# Patient Record
Sex: Female | Born: 1985 | Race: Asian | Hispanic: Yes | Marital: Married | Smoking: Never smoker
Health system: Southern US, Community
[De-identification: ages and names within clinical notes are randomized; demographics above are authoritative.]

## PROBLEM LIST (undated history)

## (undated) DIAGNOSIS — D352 Benign neoplasm of pituitary gland: Secondary | ICD-10-CM

## (undated) HISTORY — DX: Benign neoplasm of pituitary gland: D35.2

---

## 2017-08-26 ENCOUNTER — Ambulatory Visit (INDEPENDENT_AMBULATORY_CARE_PROVIDER_SITE_OTHER): Payer: BLUE CROSS/BLUE SHIELD

## 2017-08-26 ENCOUNTER — Ambulatory Visit (HOSPITAL_COMMUNITY)
Admission: EM | Admit: 2017-08-26 | Discharge: 2017-08-26 | Disposition: A | Discharge status: Home / Self Care | Payer: BLUE CROSS/BLUE SHIELD | Attending: Family Medicine | Admitting: Family Medicine

## 2017-08-26 DIAGNOSIS — M7989 Other specified soft tissue disorders: Secondary | ICD-10-CM

## 2017-08-26 DIAGNOSIS — S99911A Unspecified injury of right ankle, initial encounter: Secondary | ICD-10-CM

## 2017-08-26 DIAGNOSIS — M799 Soft tissue disorder, unspecified: Secondary | ICD-10-CM

## 2017-08-26 NOTE — ED Triage Notes (Signed)
Per pt she stepped on a trench and twisted her left ankle, per pt she normally have lower hr

## 2017-08-26 NOTE — Discharge Instructions (Signed)
[facility] on [address].  [place] [address]

## 2017-08-26 NOTE — ED Provider Notes (Signed)
  [facility]    CSN: [id number] Arrival date & time: 08/26/17  1315   Musculoskeletal Exam  Patient: Denise Jennings DOB: 12/12/1985  DOS: 08/26/2017  SUBJECTIVE:  Chief Complaint:   No chief complaint on file.   Denise Jennings is a 32 y.o.  female for evaluation and treatment of L ankle pain.   Onset:  2 days ago.  Turned ankle inwards on uneven surface.  Location: R outside of ankle Character:  aching  Progression of issue:  has slightly improved Associated symptoms: she notices a circular mass in front of her ankle Treatment: to date has been none.   Neurovascular symptoms: no  ROS: Musculoskeletal/Extremities: +L ankle pain  No known health problems.  Objective: VITAL SIGNS: BP 101/66 (BP Location: Left Arm)   Pulse (!) 50   Temp 98.3 F (36.8 C) (Oral)   SpO2 97%  Constitutional: Well formed, well developed. No acute distress. Cardiovascular: Brisk cap refill Thorax & Lungs: No accessory muscle use Musculoskeletal: L ankle.   Normal active range of motion: yes.   Normal passive range of motion: yes Tenderness to palpation: mild ttp over lat mall Deformity: no Ecchymosis: no Tests positive: none Tests negative: ant drawer, squeeze There is a small and circular lesion ant to lat mall that is freely moveable and semisolid in nature. No TTP over this area.  Neurologic: Normal sensory function. No focal deficits noted. DTR's equal and symmetry in LE's. No clonus. Psychiatric: Normal mood. Age appropriate judgment and insight. Alert & oriented x 3.    DG Left Ankle Complete IMPRESSION: Negative.  Electronically Signed   By: Rolm Baptise M.D.   On: 08/26/2017 16:07  Assessment:  Injury of right ankle, initial encounter  Soft tissue mass  Plan: Rec'd Denise Jennings for ortho if she wishes to pursue seeing an orthopod. I think this has likely been there before she turned her ankle and any pain associated with it is related to the injury, not the actual mass.  XR images printed for pt.  Find a PCP to f/u with if needed. The patient voiced understanding and agreement to the plan.    Denise Jennings, [place], [place] 08/26/17 [phone]

## 2018-05-26 ENCOUNTER — Encounter (HOSPITAL_COMMUNITY): Payer: Self-pay

## 2018-05-26 ENCOUNTER — Ambulatory Visit (HOSPITAL_COMMUNITY)
Admission: EM | Admit: 2018-05-26 | Discharge: 2018-05-26 | Disposition: A | Discharge status: Home / Self Care | Payer: BLUE CROSS/BLUE SHIELD | Attending: Physician Assistant | Admitting: Physician Assistant

## 2018-05-26 DIAGNOSIS — J00 Acute nasopharyngitis [common cold]: Secondary | ICD-10-CM

## 2018-05-26 MED ORDER — FLUTICASONE PROPIONATE 50 MCG/ACT NA SUSP: 2.0000 | Freq: Every day | NASAL | 0 refills | Status: DC

## 2018-05-26 MED ORDER — IPRATROPIUM BROMIDE 0.06 % NA SOLN: 2.0000 | Freq: Four times a day (QID) | NASAL | 0 refills | Status: DC

## 2018-05-26 NOTE — ED Provider Notes (Signed)
[facility]    CSN: [id number] Arrival date & time: 05/26/18  1138     History   Chief Complaint Chief Complaint  Patient presents with  . Cough  . Sore Throat    HPI Denise Jennings is a 32 y.o. female.   32 year old female comes in for few day history of URI symptoms.  Has had rhinorrhea, nasal congestion, sinus pressure, sore throat, ear pain.  No obvious fever, chills, night sweats.  Denies cough, shortness of breath, wheezing.  Has not taken anything for symptoms.  No obvious sick contact.  Never smoker.     History reviewed. No pertinent past medical history.  There are no active problems to display for this patient.   History reviewed. No pertinent surgical history.  OB History   None      Home Medications    Prior to Admission medications   Medication Sig Start Date End Date Taking? Authorizing Provider  fluticasone (FLONASE) 50 MCG/ACT nasal spray Place 2 sprays into both nostrils daily. 05/26/18   Tasia Catchings, Usbaldo Pannone V, PA-C  ipratropium (ATROVENT) 0.06 % nasal spray Place 2 sprays into both nostrils 4 (four) times daily. 05/26/18   Tasia Catchings, Catherin Doorn V, PA-C  norgestimate-ethinyl estradiol (ORTHO-CYCLEN,SPRINTEC,PREVIFEM) 0.25-35 MG-MCG tablet Take 1 tablet by mouth daily.    [provider]    Family History Family History  Family history unknown: Yes    Social History Social History   Tobacco Use  . Smoking status: Never Smoker  . Smokeless tobacco: Never Used  Substance Use Topics  . Alcohol use: Not on file  . Drug use: Not on file     Allergies   Patient has no known allergies.   Review of Systems Review of Systems  Reason unable to perform ROS: See HPI as above.     Physical Exam Triage Vital Signs ED Triage Vitals  Enc Vitals Group     BP 05/26/18 1254 (!) 108/58     Pulse Rate 05/26/18 1254 76     Resp 05/26/18 1254 20     Temp 05/26/18 1254 98.6 F (37 C)     Temp Source 05/26/18 1254 Oral     SpO2 05/26/18 1254 100 %   Weight --      Height --      Head Circumference --      Peak Flow --      Pain Score 05/26/18 1253 6     Pain Loc --      Pain Edu? --      Excl. in [place]? --    No data found.  Updated Vital Signs BP (!) 108/58 (BP Location: Right Arm)   Pulse 76   Temp 98.6 F (37 C) (Oral)   Resp 20   LMP 05/07/2018   SpO2 100%   Physical Exam  Constitutional: She is oriented to person, place, and time. She appears well-developed and well-nourished.  Non-toxic appearance. She does not appear ill. No distress.  HENT:  Head: Normocephalic and atraumatic.  Right Ear: Tympanic membrane, external ear and ear canal normal. Tympanic membrane is not erythematous and not bulging.  Left Ear: Tympanic membrane, external ear and ear canal normal. Tympanic membrane is not erythematous and not bulging.  Nose: Rhinorrhea present. Right sinus exhibits no maxillary sinus tenderness and no frontal sinus tenderness. Left sinus exhibits no maxillary sinus tenderness and no frontal sinus tenderness.  Mouth/Throat: Uvula is midline, oropharynx is clear and moist and mucous membranes are normal.  No tonsillar exudate.  Eyes: Pupils are equal, round, and reactive to light. Conjunctivae are normal.  Neck: Normal range of motion. Neck supple.  Cardiovascular: Normal rate, regular rhythm and normal heart sounds. Exam reveals no gallop and no friction rub.  No murmur heard. Pulmonary/Chest: Effort normal and breath sounds normal. She has no decreased breath sounds. She has no wheezes. She has no rhonchi. She has no rales.  Lymphadenopathy:    She has no cervical adenopathy.  Neurological: She is alert and oriented to person, place, and time.  Skin: Skin is warm and dry.  Psychiatric: She has a normal mood and affect. Her behavior is normal. Judgment normal.     UC Treatments / Results  Labs (all labs ordered are listed, but only abnormal results are displayed) Labs Reviewed - No data to  display  EKG None  Radiology No results found.  Procedures Procedures (including critical care time)  Medications Ordered in UC Medications - No data to display  Initial Impression / Assessment and Plan / UC Course  I have reviewed the triage vital signs and the nursing notes.  Pertinent labs & imaging results that were available during my care of the patient were reviewed by me and considered in my medical decision making (see chart for details).    Discussed with patient history and exam most consistent with viral URI. Symptomatic treatment as needed. Push fluids. Return precautions given.   Final Clinical Impressions(s) / UC Diagnoses   Final diagnoses:  Acute nasopharyngitis    ED Prescriptions    Medication Sig Dispense Auth. Provider   fluticasone (FLONASE) 50 MCG/ACT nasal spray Place 2 sprays into both nostrils daily. 1 g Rosealee Recinos V, PA-C   ipratropium (ATROVENT) 0.06 % nasal spray Place 2 sprays into both nostrils 4 (four) times daily. 15 mL Tobin Chad, [place] 05/26/18 1350

## 2018-05-26 NOTE — Discharge Instructions (Signed)
Start flonase, atrovent nasal spray for nasal congestion/drainage. You can use over the counter nasal saline rinse such as neti pot for nasal congestion. Keep hydrated, your urine should be clear to pale yellow in color. Tylenol/motrin for fever and pain. Monitor for any worsening of symptoms, chest pain, shortness of breath, wheezing, swelling of the throat, follow up for reevaluation.  ° °For sore throat/cough try using a honey-based tea. Use 3 teaspoons of honey with juice squeezed from half lemon. Place shaved pieces of ginger into 1/2-1 cup of water and warm over stove top. Then mix the ingredients and repeat every 4 hours as needed. ° °

## 2018-05-26 NOTE — ED Triage Notes (Signed)
Pt present throat pain with a cough and congestion that started on Tuesday.

## 2018-07-02 ENCOUNTER — Other Ambulatory Visit: Payer: Self-pay

## 2018-07-02 ENCOUNTER — Ambulatory Visit (HOSPITAL_COMMUNITY)
Admission: EM | Admit: 2018-07-02 | Discharge: 2018-07-02 | Disposition: A | Discharge status: Home / Self Care | Payer: BLUE CROSS/BLUE SHIELD | Attending: Urgent Care | Admitting: Urgent Care

## 2018-07-02 ENCOUNTER — Encounter (HOSPITAL_COMMUNITY): Payer: Self-pay | Admitting: Emergency Medicine

## 2018-07-02 DIAGNOSIS — Z23 Encounter for immunization: Secondary | ICD-10-CM | POA: Insufficient documentation

## 2018-07-02 DIAGNOSIS — M79645 Pain in left finger(s): Secondary | ICD-10-CM | POA: Diagnosis not present

## 2018-07-02 DIAGNOSIS — W260XXA Contact with knife, initial encounter: Secondary | ICD-10-CM | POA: Diagnosis not present

## 2018-07-02 DIAGNOSIS — S61317A Laceration without foreign body of left little finger with damage to nail, initial encounter: Secondary | ICD-10-CM | POA: Insufficient documentation

## 2018-07-02 MED ORDER — TETANUS-DIPHTH-ACELL PERTUSSIS 5-2.5-18.5 LF-MCG/0.5 IM SUSP
INTRAMUSCULAR | Status: AC
  Filled 2018-07-02: qty 0.5

## 2018-07-02 MED ORDER — MELOXICAM 7.5 MG PO TABS: 7.5000 mg | ORAL_TABLET | Freq: Every day | ORAL | 0 refills | Status: DC

## 2018-07-02 MED ORDER — TETANUS-DIPHTH-ACELL PERTUSSIS 5-2.5-18.5 LF-MCG/0.5 IM SUSP
0.5000 mL | Freq: Once | INTRAMUSCULAR | Status: AC
  Administered 2018-07-02: 0.5 mL via INTRAMUSCULAR

## 2018-07-02 NOTE — ED Triage Notes (Addendum)
Cut finger yesterday while chopping vegetables.  Injury to left little finger.  Avulsion to finger tip

## 2018-07-02 NOTE — ED Provider Notes (Signed)
  MRN: [id number] DOB: Dec 26, 1985  Subjective:   Denise Jennings is a 33 y.o. female presenting for >24 left little finger laceration.   Patient was using a knife to cut vegetables and accidentally cut her hand.  Reports having constant, mild achy pain over the wound with intermittent sharp pains. She has cleaned her wound and applied Neosporin.  She cannot recall her last Tdap.  Denies fever, nausea, vomiting, loss of sensation, redness, warmth, drainage of pus or bleeding.  She has kept her wound covered with a Band-Aid.  No current facility-administered medications for this encounter.   Current Outpatient Medications:  .  fluticasone (FLONASE) 50 MCG/ACT nasal spray, Place 2 sprays into both nostrils daily., Disp: 1 g, Rfl: 0 .  ipratropium (ATROVENT) 0.06 % nasal spray, Place 2 sprays into both nostrils 4 (four) times daily., Disp: 15 mL, Rfl: 0 .  norgestimate-ethinyl estradiol (ORTHO-CYCLEN,SPRINTEC,PREVIFEM) 0.25-35 MG-MCG tablet, Take 1 tablet by mouth daily., Disp: , Rfl:    No Known Allergies  Denies past medical and surgical history.   Objective:   Vitals: BP (!) 100/52 (BP Location: Left Arm)   Pulse 62   Temp 98.6 F (37 C) (Oral)   Resp 16   SpO2 100%   Physical Exam Constitutional:      General: She is not in acute distress.    Appearance: Normal appearance. She is well-developed. She is not ill-appearing.  HENT:     Head: Normocephalic and atraumatic.     Nose: Nose normal.     Mouth/Throat:     Mouth: Mucous membranes are moist.     Pharynx: Oropharynx is clear.  Eyes:     General: No scleral icterus.    Extraocular Movements: Extraocular movements intact.     Pupils: Pupils are equal, round, and reactive to light.  Cardiovascular:     Rate and Rhythm: Normal rate.  Pulmonary:     Effort: Pulmonary effort is normal.  Musculoskeletal:     Left hand: She exhibits tenderness and laceration. She exhibits normal range of motion, no bony tenderness, normal capillary  refill and no swelling. Normal sensation noted. Normal strength noted.       Hands:  Skin:    General: Skin is warm and dry.  Neurological:     General: No focal deficit present.     Mental Status: She is alert and oriented to person, place, and time.  Psychiatric:        Mood and Affect: Mood normal.        Behavior: Behavior normal.    Assessment and Plan :   Laceration of left little finger with damage to nail, foreign body presence unspecified, initial encounter  Finger pain, left  Need for diphtheria-tetanus-pertussis (Tdap) vaccine  Wound not amenable to repair given the nature and age of the wound.  Counseled on wound care, updated her Tdap.  She is to use meloxicam at home.  ER and return to clinic precautions reviewed.   Roy, [place], PA-C 07/02/18 1118

## 2018-07-02 NOTE — Discharge Instructions (Signed)
Change your dressing twice daily for the next 1-2 weeks as your scab heals and your skin is replaced. Remove your dressing gently by soaking in warm soapy water for 5-10 minutes. If you develop redness, fever, hot finger/hand, worsening pain, then return for a recheck.

## 2019-04-18 DIAGNOSIS — M67431 Ganglion, right wrist: Secondary | ICD-10-CM | POA: Diagnosis not present

## 2019-07-04 DIAGNOSIS — Z03818 Encounter for observation for suspected exposure to other biological agents ruled out: Secondary | ICD-10-CM | POA: Diagnosis not present

## 2019-09-24 DIAGNOSIS — Z6823 Body mass index (BMI) 23.0-23.9, adult: Secondary | ICD-10-CM | POA: Diagnosis not present

## 2019-09-24 DIAGNOSIS — E282 Polycystic ovarian syndrome: Secondary | ICD-10-CM | POA: Diagnosis not present

## 2019-09-24 DIAGNOSIS — Z01419 Encounter for gynecological examination (general) (routine) without abnormal findings: Secondary | ICD-10-CM | POA: Diagnosis not present

## 2020-05-29 DIAGNOSIS — Z20822 Contact with and (suspected) exposure to covid-19: Secondary | ICD-10-CM | POA: Diagnosis not present

## 2020-07-16 DIAGNOSIS — Z20822 Contact with and (suspected) exposure to covid-19: Secondary | ICD-10-CM | POA: Diagnosis not present

## 2020-10-28 DIAGNOSIS — Z1329 Encounter for screening for other suspected endocrine disorder: Secondary | ICD-10-CM | POA: Diagnosis not present

## 2020-10-28 DIAGNOSIS — N926 Irregular menstruation, unspecified: Secondary | ICD-10-CM | POA: Diagnosis not present

## 2020-10-28 DIAGNOSIS — Z131 Encounter for screening for diabetes mellitus: Secondary | ICD-10-CM | POA: Diagnosis not present

## 2020-10-28 DIAGNOSIS — Z319 Encounter for procreative management, unspecified: Secondary | ICD-10-CM | POA: Diagnosis not present

## 2020-10-28 DIAGNOSIS — Z13228 Encounter for screening for other metabolic disorders: Secondary | ICD-10-CM | POA: Diagnosis not present

## 2020-10-28 DIAGNOSIS — Z01419 Encounter for gynecological examination (general) (routine) without abnormal findings: Secondary | ICD-10-CM | POA: Diagnosis not present

## 2020-10-28 DIAGNOSIS — Z6825 Body mass index (BMI) 25.0-25.9, adult: Secondary | ICD-10-CM | POA: Diagnosis not present

## 2020-10-28 DIAGNOSIS — Z130 Encounter for screening for diseases of the blood and blood-forming organs and certain disorders involving the immune mechanism: Secondary | ICD-10-CM | POA: Diagnosis not present

## 2020-10-28 DIAGNOSIS — Z1388 Encounter for screening for disorder due to exposure to contaminants: Secondary | ICD-10-CM | POA: Diagnosis not present

## 2020-10-29 DIAGNOSIS — Z01419 Encounter for gynecological examination (general) (routine) without abnormal findings: Secondary | ICD-10-CM | POA: Diagnosis not present

## 2020-11-03 DIAGNOSIS — R891 Abnormal level of hormones in specimens from other organs, systems and tissues: Secondary | ICD-10-CM | POA: Diagnosis not present

## 2020-11-11 ENCOUNTER — Other Ambulatory Visit: Payer: Self-pay | Admitting: Obstetrics and Gynecology

## 2020-11-11 DIAGNOSIS — R947 Abnormal results of other endocrine function studies: Secondary | ICD-10-CM

## 2020-11-28 ENCOUNTER — Other Ambulatory Visit: Payer: Self-pay

## 2020-11-28 ENCOUNTER — Ambulatory Visit
Admission: RE | Admit: 2020-11-28 | Discharge: 2020-11-28 | Disposition: A | Discharge status: Home / Self Care | Payer: BLUE CROSS/BLUE SHIELD | Source: Ambulatory Visit | Attending: Obstetrics and Gynecology | Admitting: Obstetrics and Gynecology

## 2020-11-28 DIAGNOSIS — E237 Disorder of pituitary gland, unspecified: Secondary | ICD-10-CM | POA: Diagnosis not present

## 2020-11-28 DIAGNOSIS — J320 Chronic maxillary sinusitis: Secondary | ICD-10-CM | POA: Diagnosis not present

## 2020-11-28 DIAGNOSIS — R947 Abnormal results of other endocrine function studies: Secondary | ICD-10-CM

## 2020-11-28 DIAGNOSIS — E221 Hyperprolactinemia: Secondary | ICD-10-CM | POA: Diagnosis not present

## 2020-11-28 MED ORDER — GADOBENATE DIMEGLUMINE 529 MG/ML IV SOLN
7.0000 mL | Freq: Once | INTRAVENOUS | Status: AC | PRN
  Administered 2020-11-28: 11:00:00 7 mL via INTRAVENOUS

## 2021-03-04 ENCOUNTER — Other Ambulatory Visit: Payer: Self-pay

## 2021-03-04 ENCOUNTER — Encounter (HOSPITAL_BASED_OUTPATIENT_CLINIC_OR_DEPARTMENT_OTHER): Payer: Self-pay | Admitting: *Deleted

## 2021-03-04 ENCOUNTER — Emergency Department (HOSPITAL_BASED_OUTPATIENT_CLINIC_OR_DEPARTMENT_OTHER)
Admission: EM | Admit: 2021-03-04 | Discharge: 2021-03-04 | Disposition: A | Discharge status: Home / Self Care | Payer: 59 | Attending: Emergency Medicine | Admitting: Emergency Medicine

## 2021-03-04 ENCOUNTER — Emergency Department (HOSPITAL_BASED_OUTPATIENT_CLINIC_OR_DEPARTMENT_OTHER): Payer: 59 | Admitting: Radiology

## 2021-03-04 ENCOUNTER — Emergency Department (HOSPITAL_BASED_OUTPATIENT_CLINIC_OR_DEPARTMENT_OTHER): Payer: 59

## 2021-03-04 DIAGNOSIS — Z041 Encounter for examination and observation following transport accident: Secondary | ICD-10-CM | POA: Diagnosis not present

## 2021-03-04 DIAGNOSIS — R0789 Other chest pain: Secondary | ICD-10-CM | POA: Diagnosis not present

## 2021-03-04 DIAGNOSIS — S0990XA Unspecified injury of head, initial encounter: Secondary | ICD-10-CM | POA: Diagnosis not present

## 2021-03-04 DIAGNOSIS — S199XXA Unspecified injury of neck, initial encounter: Secondary | ICD-10-CM | POA: Diagnosis not present

## 2021-03-04 DIAGNOSIS — S161XXA Strain of muscle, fascia and tendon at neck level, initial encounter: Secondary | ICD-10-CM | POA: Insufficient documentation

## 2021-03-04 DIAGNOSIS — Y92410 Unspecified street and highway as the place of occurrence of the external cause: Secondary | ICD-10-CM | POA: Insufficient documentation

## 2021-03-04 DIAGNOSIS — R519 Headache, unspecified: Secondary | ICD-10-CM | POA: Insufficient documentation

## 2021-03-04 MED ORDER — METHOCARBAMOL 500 MG PO TABS: 500.0000 mg | ORAL_TABLET | Freq: Two times a day (BID) | ORAL | 0 refills | Status: DC
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
## 2021-03-08 ENCOUNTER — Other Ambulatory Visit: Payer: Self-pay

## 2021-03-08 ENCOUNTER — Ambulatory Visit: Payer: 59 | Admitting: Endocrinology

## 2021-03-08 ENCOUNTER — Encounter: Payer: Self-pay | Admitting: Endocrinology

## 2021-03-08 DIAGNOSIS — D352 Benign neoplasm of pituitary gland: Secondary | ICD-10-CM

## 2021-03-08 MED ORDER — CABERGOLINE 0.5 MG PO TABS: 0.2500 mg | ORAL_TABLET | ORAL | 3 refills | Status: DC

## 2021-03-08 NOTE — Patient Instructions (Signed)
I have sent a prescription to your pharmacy, for a pill to lower the prolactin.   Please stop the pill if the pregnancy happens.   Please redo the blood test in approx 1 month.  Please come back for a follow-up appointment in 6 months.

## 2021-03-08 NOTE — Progress Notes (Signed)
Subjective:    Patient ID: Denise Jennings, female    DOB: 1985/08/08, 35 y.o.   MRN: [id number]  HPI Pt is ref by Dr Corinna Capra, for pituitary nodule.  This was found in 2022.  she has no prior h/o pituitary problem.  No h/o PUD, gallstones, rash, or DM.  She has intermitt lightheadedness.  She has chronically irreg menses.    Social History   Socioeconomic History   Marital status: Single    Spouse name: Not on file   Number of children: Not on file   Years of education: Not on file   Highest education level: Not on file  Occupational History   Not on file  Tobacco Use   Smoking status: Never   Smokeless tobacco: Never  Vaping Use   Vaping Use: Never used  Substance and Sexual Activity   Alcohol use: Never   Drug use: Never   Sexual activity: Yes    Birth control/protection: Pill  Other Topics Concern   Not on file  Social History Narrative   Not on file   Social Determinants of Health   Financial Resource Strain: Not on file  Food Insecurity: Not on file  Transportation Needs: Not on file  Physical Activity: Not on file  Stress: Not on file  Social Connections: Not on file  Intimate Partner Violence: Not on file    Current Outpatient Medications on File Prior to Visit  Medication Sig Dispense Refill   methocarbamol (ROBAXIN) 500 MG tablet Take 1 tablet (500 mg total) by mouth 2 (two) times daily. 20 tablet 0   No current facility-administered medications on file prior to visit.    Allergies  Allergen Reactions   Contrast Media [Iodinated Diagnostic Agents] Other (See Comments)    "Feels like needles pinching my body"    Family History  Problem Relation Age of Onset   Other Neg Hx     BP 96/70 (BP Location: Right Arm, Patient Position: Sitting, Cuff Size: Normal)   Pulse 62   Ht 5\' 3"  (1.6 m)   Wt 142 lb 6.4 oz (64.6 kg)   LMP 03/02/2021   SpO2 99%   BMI 25.23 kg/m    Review of Systems denies loss of smell, headache, galactorrhea, and n/v.  She has gained  a few lbs.      Objective:   Physical Exam VS: see vs page GEN: no distress HEAD: head: no deformity eyes: no periorbital swelling, no proptosis external nose and ears are normal NECK: supple, thyroid is not enlarged CHEST WALL: no deformity LUNGS: clear to auscultation CV: reg rate and rhythm, no murmur.  MUSCULOSKELETAL: gait is normal and steady EXTEMITIES: no deformity.  no leg edema NEURO:  readily moves all 4's.  sensation is intact to touch on all 4's SKIN:  Normal texture and temperature.  No rash or suspicious lesion is visible.   NODES:  None palpable at the neck.   PSYCH: alert, well-oriented.  Does not appear anxious nor depressed.     MRI: 4 mm area of delayed enhancement along the far right side of the gland. In association with slight leftward deviation of the pituitary stalk, this is suggestive of a pituitary microadenoma  TSH=1 Prolactin=33  I have reviewed outside records, and summarized: Pt was noted to have elevated prolactin, and referred here.  She has been attempting pregnancy x a few mos, without success.       Assessment & Plan:  Pituitary nodule, prob adenoma Hyperprolactinemia, new  to me, uncontrolled.  Prob due to the above.    Patient Instructions  I have sent a prescription to your pharmacy, for a pill to lower the prolactin.   Please stop the pill if the pregnancy happens.   Please redo the blood test in approx 1 month.  Please come back for a follow-up appointment in 6 months.

## 2021-03-16 DIAGNOSIS — T50995A Adverse effect of other drugs, medicaments and biological substances, initial encounter: Secondary | ICD-10-CM | POA: Diagnosis not present

## 2021-03-16 DIAGNOSIS — Z7689 Persons encountering health services in other specified circumstances: Secondary | ICD-10-CM | POA: Diagnosis not present

## 2021-03-16 DIAGNOSIS — E282 Polycystic ovarian syndrome: Secondary | ICD-10-CM | POA: Diagnosis not present

## 2021-03-16 DIAGNOSIS — D352 Benign neoplasm of pituitary gland: Secondary | ICD-10-CM | POA: Diagnosis not present

## 2021-03-16 DIAGNOSIS — N926 Irregular menstruation, unspecified: Secondary | ICD-10-CM | POA: Diagnosis not present

## 2021-04-07 ENCOUNTER — Other Ambulatory Visit: Payer: Self-pay

## 2021-04-07 ENCOUNTER — Other Ambulatory Visit (INDEPENDENT_AMBULATORY_CARE_PROVIDER_SITE_OTHER): Payer: 59

## 2021-04-07 DIAGNOSIS — D352 Benign neoplasm of pituitary gland: Secondary | ICD-10-CM

## 2021-04-08 LAB — PROLACTIN: Prolactin: 6 ng/mL

## 2021-06-09 ENCOUNTER — Other Ambulatory Visit (HOSPITAL_COMMUNITY): Payer: Self-pay

## 2021-06-09 ENCOUNTER — Telehealth: Payer: Self-pay | Admitting: Endocrinology

## 2021-06-09 DIAGNOSIS — D352 Benign neoplasm of pituitary gland: Secondary | ICD-10-CM

## 2021-06-09 MED ORDER — CABERGOLINE 0.5 MG PO TABS
0.2500 mg | ORAL_TABLET | ORAL | 3 refills | Status: DC
  Filled 2021-06-09: qty 12, 84d supply, fill #0
  Filled 2021-08-10: qty 12, 84d supply, fill #1

## 2021-06-09 NOTE — Telephone Encounter (Signed)
RX now sent to updated pharmacy

## 2021-06-09 NOTE — Telephone Encounter (Signed)
MEDICATION: cabergoline (DOSTINEX) 0.5 MG tablet  PHARMACY:  [facility] [address], [place] [address]  HAS THE PATIENT CONTACTED THEIR PHARMACY?  Yes  IS THIS A 90 DAY SUPPLY : Yes  IS PATIENT OUT OF MEDICATION: Yes  IF NOT; HOW MUCH IS LEFT:   LAST APPOINTMENT DATE: @9 /19/2022  NEXT APPOINTMENT DATE:@3 /21/2023  DO WE HAVE YOUR PERMISSION TO LEAVE A DETAILED MESSAGE?:  OTHER COMMENTS: This is a new pharmacy that needs to be added to PT account.   **Let patient know to contact pharmacy at the end of the day to make sure medication is ready. **  ** Please notify patient to allow 48-72 hours to process**  **Encourage patient to contact the pharmacy for refills or they can request refills through [facility]**

## 2021-07-10 DIAGNOSIS — J029 Acute pharyngitis, unspecified: Secondary | ICD-10-CM | POA: Diagnosis not present

## 2021-07-10 DIAGNOSIS — B349 Viral infection, unspecified: Secondary | ICD-10-CM | POA: Diagnosis not present

## 2021-07-10 DIAGNOSIS — R0981 Nasal congestion: Secondary | ICD-10-CM | POA: Diagnosis not present

## 2021-07-10 DIAGNOSIS — Z20822 Contact with and (suspected) exposure to covid-19: Secondary | ICD-10-CM | POA: Diagnosis not present

## 2021-07-14 DIAGNOSIS — J111 Influenza due to unidentified influenza virus with other respiratory manifestations: Secondary | ICD-10-CM | POA: Diagnosis not present

## 2021-07-14 DIAGNOSIS — R051 Acute cough: Secondary | ICD-10-CM | POA: Diagnosis not present

## 2021-07-14 DIAGNOSIS — J988 Other specified respiratory disorders: Secondary | ICD-10-CM | POA: Diagnosis not present

## 2021-07-14 DIAGNOSIS — J069 Acute upper respiratory infection, unspecified: Secondary | ICD-10-CM | POA: Diagnosis not present

## 2021-08-10 ENCOUNTER — Other Ambulatory Visit (HOSPITAL_COMMUNITY): Payer: Self-pay

## 2021-08-11 ENCOUNTER — Other Ambulatory Visit (HOSPITAL_COMMUNITY): Payer: Self-pay

## 2021-08-12 DIAGNOSIS — J209 Acute bronchitis, unspecified: Secondary | ICD-10-CM | POA: Diagnosis not present

## 2021-08-23 ENCOUNTER — Other Ambulatory Visit (HOSPITAL_COMMUNITY): Payer: Self-pay

## 2021-09-07 ENCOUNTER — Ambulatory Visit: Payer: 59 | Admitting: Endocrinology

## 2021-09-15 ENCOUNTER — Other Ambulatory Visit (HOSPITAL_COMMUNITY): Payer: Self-pay

## 2021-09-15 ENCOUNTER — Other Ambulatory Visit: Payer: Self-pay

## 2021-09-15 ENCOUNTER — Encounter: Payer: Self-pay | Admitting: Endocrinology

## 2021-09-15 ENCOUNTER — Ambulatory Visit: Payer: 59 | Admitting: Endocrinology

## 2021-09-15 VITALS — BP 100/68 | HR 60 | Ht 63.0 in | Wt 146.4 lb

## 2021-09-15 DIAGNOSIS — D352 Benign neoplasm of pituitary gland: Secondary | ICD-10-CM

## 2021-09-15 DIAGNOSIS — E282 Polycystic ovarian syndrome: Secondary | ICD-10-CM | POA: Diagnosis not present

## 2021-09-15 DIAGNOSIS — M25562 Pain in left knee: Secondary | ICD-10-CM | POA: Diagnosis not present

## 2021-09-15 LAB — FOLLICLE STIMULATING HORMONE: FSH: 0.1 m[IU]/mL

## 2021-09-15 LAB — GLUCOSE, RANDOM: Glucose, Bld: 69 mg/dL — ABNORMAL LOW (ref 70–99)

## 2021-09-15 LAB — LUTEINIZING HORMONE: LH: 0.67 m[IU]/mL

## 2021-09-15 LAB — HCG, QUANTITATIVE, PREGNANCY: Quantitative HCG: 29438 m[IU]/mL

## 2021-09-15 LAB — HEMOGLOBIN A1C: Hgb A1c MFr Bld: 5 % (ref 4.6–6.5)

## 2021-09-15 LAB — TSH: TSH: 1.06 u[IU]/mL (ref 0.35–5.50)

## 2021-09-15 NOTE — Progress Notes (Signed)
? ?  Subjective:  ? ? Patient ID: Denise Jennings, female    DOB: [birth year], 36 y.o.   MRN: [id number] ? 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  She takes parlodel as rx'ed.  No menses x 3 mos, but she feels well, and does not think she is pregnant.  She was rx'ed metformin, but did not take.  ? ?Social History  ? ?Socioeconomic History  ? Marital status: Married  ?  Spouse name: Not on file  ? Number of children: Not on file  ? Years of education: Not on file  ? Highest education level: Not on file  ?Occupational History  ? Not on file  ?Tobacco Use  ? Smoking status: Never  ? Smokeless tobacco: Never  ?Vaping Use  ? Vaping Use: Never used  ?Substance and Sexual Activity  ? Alcohol use: Never  ? Drug use: Never  ? Sexual activity: Yes  ?  Birth control/protection: Pill  ?Other Topics Concern  ? Not on file  ?Social History Narrative  ? Not on file  ? ?Social Determinants of Health  ? ?Financial Resource Strain: Not on file  ?Food Insecurity: Not on file  ?Transportation Needs: Not on file  ?Physical Activity: Not on file  ?Stress: Not on file  ?Social Connections: Not on file  ?Intimate Partner Violence: Not on file  ? ? ?Current Outpatient Medications on File Prior to Visit  ?Medication Sig Dispense Refill  ? cabergoline (DOSTINEX) 0.5 MG tablet Take 0.5 tablets (0.25 mg total) by mouth 2 (two) times a week. 12 tablet 3  ? ?No current facility-administered medications on file prior to visit.  ? ? ? ?Family History  ?Problem Relation Age of Onset  ? Other Neg Hx   ? ? ?BP 100/68 (BP Location: Left Arm, Patient Position: Sitting, Cuff Size: Normal)   Pulse 60   Ht '5\' 3"'$  (1.6 m)   Wt 146 lb 6.4 oz (66.4 kg)   SpO2 100%   BMI 25.93 kg/m?  ? ? ?Review of Systems ?Denies nausea and dizziness ?   ?Objective:  ? Physical  Exam ?VITAL SIGNS:  See vs page ?GENERAL: no distress ?Skin: no hirsutism.  ? ?hCG=29K ?   ?Assessment & Plan:  ?Pregnancy, new.  D/c cabergoline ?PCO: check A1c ? ?

## 2021-09-15 NOTE — Patient Instructions (Addendum)
Blood tests are requested for you today.  We'll let you know about the results.   ?Based on the results, adding metformin may help.   ?Please stop the bromocriptine if the pregnancy happens.   ?If the prolactin is normal, please see Dr Corinna Capra about the fertility.   ?Please come back for a follow-up appointment in 6 months.   ?

## 2021-09-16 ENCOUNTER — Encounter: Payer: Self-pay | Admitting: Endocrinology

## 2021-09-16 LAB — PROLACTIN: Prolactin: 11.9 ng/mL

## 2021-09-16 LAB — INSULIN, RANDOM: Insulin: 7.3 u[IU]/mL

## 2021-09-20 DIAGNOSIS — N911 Secondary amenorrhea: Secondary | ICD-10-CM | POA: Diagnosis not present

## 2021-09-22 DIAGNOSIS — Z3685 Encounter for antenatal screening for Streptococcus B: Secondary | ICD-10-CM | POA: Diagnosis not present

## 2021-09-22 DIAGNOSIS — Z3481 Encounter for supervision of other normal pregnancy, first trimester: Secondary | ICD-10-CM | POA: Diagnosis not present

## 2021-09-22 LAB — HEPATITIS C ANTIBODY: HCV Ab: NEGATIVE

## 2021-09-22 LAB — OB RESULTS CONSOLE HEPATITIS B SURFACE ANTIGEN: Hepatitis B Surface Ag: NEGATIVE

## 2021-09-22 LAB — OB RESULTS CONSOLE RUBELLA ANTIBODY, IGM: Rubella: IMMUNE

## 2021-09-22 LAB — OB RESULTS CONSOLE GC/CHLAMYDIA
Chlamydia: NEGATIVE
Neisseria Gonorrhea: NEGATIVE

## 2021-09-22 LAB — OB RESULTS CONSOLE ABO/RH: RH Type: POSITIVE

## 2021-09-22 LAB — OB RESULTS CONSOLE RPR: RPR: NONREACTIVE

## 2021-09-22 LAB — OB RESULTS CONSOLE ANTIBODY SCREEN: Antibody Screen: NEGATIVE

## 2021-09-22 LAB — OB RESULTS CONSOLE HIV ANTIBODY (ROUTINE TESTING): HIV: NONREACTIVE

## 2021-09-23 DIAGNOSIS — Z3A27 27 weeks gestation of pregnancy: Secondary | ICD-10-CM | POA: Diagnosis not present

## 2021-09-23 DIAGNOSIS — Z363 Encounter for antenatal screening for malformations: Secondary | ICD-10-CM | POA: Diagnosis not present

## 2021-09-23 DIAGNOSIS — Z23 Encounter for immunization: Secondary | ICD-10-CM | POA: Diagnosis not present

## 2021-09-23 DIAGNOSIS — Z3400 Encounter for supervision of normal first pregnancy, unspecified trimester: Secondary | ICD-10-CM | POA: Diagnosis not present

## 2021-09-28 DIAGNOSIS — M2202 Recurrent dislocation of patella, left knee: Secondary | ICD-10-CM | POA: Diagnosis not present

## 2021-09-30 DIAGNOSIS — O99810 Abnormal glucose complicating pregnancy: Secondary | ICD-10-CM | POA: Diagnosis not present

## 2021-10-06 DIAGNOSIS — M2202 Recurrent dislocation of patella, left knee: Secondary | ICD-10-CM | POA: Diagnosis not present

## 2021-10-12 DIAGNOSIS — M2202 Recurrent dislocation of patella, left knee: Secondary | ICD-10-CM | POA: Diagnosis not present

## 2021-10-15 DIAGNOSIS — M25562 Pain in left knee: Secondary | ICD-10-CM | POA: Diagnosis not present

## 2021-10-15 DIAGNOSIS — M2202 Recurrent dislocation of patella, left knee: Secondary | ICD-10-CM | POA: Diagnosis not present

## 2021-10-19 DIAGNOSIS — M2202 Recurrent dislocation of patella, left knee: Secondary | ICD-10-CM | POA: Diagnosis not present

## 2021-10-22 DIAGNOSIS — M2202 Recurrent dislocation of patella, left knee: Secondary | ICD-10-CM | POA: Diagnosis not present

## 2021-10-25 DIAGNOSIS — M2202 Recurrent dislocation of patella, left knee: Secondary | ICD-10-CM | POA: Diagnosis not present

## 2021-10-27 DIAGNOSIS — M2202 Recurrent dislocation of patella, left knee: Secondary | ICD-10-CM | POA: Diagnosis not present

## 2021-11-02 DIAGNOSIS — M2202 Recurrent dislocation of patella, left knee: Secondary | ICD-10-CM | POA: Diagnosis not present

## 2021-11-03 DIAGNOSIS — M2202 Recurrent dislocation of patella, left knee: Secondary | ICD-10-CM | POA: Diagnosis not present

## 2021-11-08 DIAGNOSIS — M2202 Recurrent dislocation of patella, left knee: Secondary | ICD-10-CM | POA: Diagnosis not present

## 2021-11-09 DIAGNOSIS — Z3A34 34 weeks gestation of pregnancy: Secondary | ICD-10-CM | POA: Diagnosis not present

## 2021-11-09 DIAGNOSIS — Z3400 Encounter for supervision of normal first pregnancy, unspecified trimester: Secondary | ICD-10-CM | POA: Diagnosis not present

## 2021-11-09 DIAGNOSIS — O368130 Decreased fetal movements, third trimester, not applicable or unspecified: Secondary | ICD-10-CM | POA: Diagnosis not present

## 2021-11-11 DIAGNOSIS — M2202 Recurrent dislocation of patella, left knee: Secondary | ICD-10-CM | POA: Diagnosis not present

## 2021-11-12 DIAGNOSIS — M25562 Pain in left knee: Secondary | ICD-10-CM | POA: Diagnosis not present

## 2021-11-22 DIAGNOSIS — Z3685 Encounter for antenatal screening for Streptococcus B: Secondary | ICD-10-CM | POA: Diagnosis not present

## 2021-11-22 LAB — OB RESULTS CONSOLE GBS: GBS: NEGATIVE

## 2021-11-23 DIAGNOSIS — M2202 Recurrent dislocation of patella, left knee: Secondary | ICD-10-CM | POA: Diagnosis not present

## 2021-11-25 DIAGNOSIS — M2202 Recurrent dislocation of patella, left knee: Secondary | ICD-10-CM | POA: Diagnosis not present

## 2021-12-08 ENCOUNTER — Encounter (HOSPITAL_COMMUNITY): Payer: Self-pay | Admitting: *Deleted

## 2021-12-08 ENCOUNTER — Telehealth (HOSPITAL_COMMUNITY): Payer: Self-pay | Admitting: *Deleted

## 2021-12-08 NOTE — Telephone Encounter (Signed)
Preadmission screen  

## 2021-12-15 ENCOUNTER — Inpatient Hospital Stay (HOSPITAL_COMMUNITY)
Admission: AD | Admit: 2021-12-15 | Discharge: 2021-12-17 | DRG: 807 | Disposition: A | Discharge status: Home / Self Care | Payer: 59 | Attending: Obstetrics and Gynecology | Admitting: Obstetrics and Gynecology

## 2021-12-15 ENCOUNTER — Other Ambulatory Visit: Payer: Self-pay

## 2021-12-15 DIAGNOSIS — Z3A39 39 weeks gestation of pregnancy: Secondary | ICD-10-CM | POA: Diagnosis not present

## 2021-12-15 DIAGNOSIS — Z3490 Encounter for supervision of normal pregnancy, unspecified, unspecified trimester: Principal | ICD-10-CM

## 2021-12-16 ENCOUNTER — Encounter (HOSPITAL_COMMUNITY): Payer: Self-pay | Admitting: Obstetrics and Gynecology

## 2021-12-16 DIAGNOSIS — Z3A39 39 weeks gestation of pregnancy: Secondary | ICD-10-CM | POA: Diagnosis not present

## 2021-12-16 DIAGNOSIS — Z3490 Encounter for supervision of normal pregnancy, unspecified, unspecified trimester: Principal | ICD-10-CM

## 2021-12-16 DIAGNOSIS — O26893 Other specified pregnancy related conditions, third trimester: Secondary | ICD-10-CM | POA: Diagnosis present

## 2021-12-16 LAB — CBC
HCT: 37 % (ref 36.0–46.0)
Hemoglobin: 13 g/dL (ref 12.0–15.0)
MCH: 32.8 pg (ref 26.0–34.0)
MCHC: 35.1 g/dL (ref 30.0–36.0)
MCV: 93.4 fL (ref 80.0–100.0)
Platelets: 198 10*3/uL (ref 150–400)
RBC: 3.96 MIL/uL (ref 3.87–5.11)
RDW: 12.5 % (ref 11.5–15.5)
WBC: 12.7 10*3/uL — ABNORMAL HIGH (ref 4.0–10.5)
nRBC: 0 % (ref 0.0–0.2)

## 2021-12-16 LAB — TYPE AND SCREEN
ABO/RH(D): O POS
Antibody Screen: NEGATIVE

## 2021-12-16 LAB — RPR: RPR Ser Ql: NONREACTIVE

## 2021-12-16 MED ORDER — ONDANSETRON HCL 4 MG/2ML IJ SOLN: 4.0000 mg | INTRAMUSCULAR | Status: DC | PRN

## 2021-12-16 MED ORDER — PRENATAL MULTIVITAMIN CH
1.0000 | ORAL_TABLET | Freq: Every day | ORAL | Status: DC
  Administered 2021-12-16: 1 via ORAL
  Filled 2021-12-16 (×2): qty 1

## 2021-12-16 MED ORDER — OXYCODONE HCL 5 MG PO TABS: 10.0000 mg | ORAL_TABLET | ORAL | Status: DC | PRN

## 2021-12-16 MED ORDER — OXYCODONE-ACETAMINOPHEN 5-325 MG PO TABS: 2.0000 | ORAL_TABLET | ORAL | Status: DC | PRN

## 2021-12-16 MED ORDER — LIDOCAINE HCL (PF) 1 % IJ SOLN
30.0000 mL | INTRAMUSCULAR | Status: AC | PRN
  Administered 2021-12-16: 30 mL via SUBCUTANEOUS
  Filled 2021-12-16: qty 30

## 2021-12-16 MED ORDER — OXYTOCIN 10 UNIT/ML IJ SOLN: 10.0000 [IU] | Freq: Once | INTRAMUSCULAR | Status: DC

## 2021-12-16 MED ORDER — ONDANSETRON HCL 4 MG/2ML IJ SOLN
4.0000 mg | Freq: Four times a day (QID) | INTRAMUSCULAR | Status: DC | PRN
Start: 2021-12-16 — End: 2021-12-16

## 2021-12-16 MED ORDER — LACTATED RINGERS IV SOLN: INTRAVENOUS | Status: DC

## 2021-12-16 MED ORDER — SENNOSIDES-DOCUSATE SODIUM 8.6-50 MG PO TABS
2.0000 | ORAL_TABLET | ORAL | Status: DC
  Filled 2021-12-16: qty 2

## 2021-12-16 MED ORDER — ZOLPIDEM TARTRATE 5 MG PO TABS: 5.0000 mg | ORAL_TABLET | Freq: Every evening | ORAL | Status: DC | PRN

## 2021-12-16 MED ORDER — ACETAMINOPHEN 325 MG PO TABS: 650.0000 mg | ORAL_TABLET | ORAL | Status: DC | PRN

## 2021-12-16 MED ORDER — DIBUCAINE (PERIANAL) 1 % EX OINT: 1.0000 | TOPICAL_OINTMENT | CUTANEOUS | Status: DC | PRN

## 2021-12-16 MED ORDER — TETANUS-DIPHTH-ACELL PERTUSSIS 5-2.5-18.5 LF-MCG/0.5 IM SUSY: 0.5000 mL | PREFILLED_SYRINGE | Freq: Once | INTRAMUSCULAR | Status: DC

## 2021-12-16 MED ORDER — IBUPROFEN 600 MG PO TABS
600.0000 mg | ORAL_TABLET | Freq: Four times a day (QID) | ORAL | Status: DC
  Filled 2021-12-16 (×3): qty 1

## 2021-12-16 MED ORDER — FLEET ENEMA 7-19 GM/118ML RE ENEM: 1.0000 | ENEMA | RECTAL | Status: DC | PRN

## 2021-12-16 MED ORDER — OXYCODONE HCL 5 MG PO TABS: 5.0000 mg | ORAL_TABLET | ORAL | Status: DC | PRN

## 2021-12-16 MED ORDER — OXYTOCIN BOLUS FROM INFUSION: 333.0000 mL | Freq: Once | INTRAVENOUS | Status: DC

## 2021-12-16 MED ORDER — COCONUT OIL OIL: 1.0000 | TOPICAL_OIL | Status: DC | PRN

## 2021-12-16 MED ORDER — BENZOCAINE-MENTHOL 20-0.5 % EX AERO
1.0000 | INHALATION_SPRAY | CUTANEOUS | Status: DC | PRN
  Administered 2021-12-16: 1 via TOPICAL
  Filled 2021-12-16: qty 56

## 2021-12-16 MED ORDER — LACTATED RINGERS IV SOLN: 500.0000 mL | INTRAVENOUS | Status: DC | PRN

## 2021-12-16 MED ORDER — OXYCODONE-ACETAMINOPHEN 5-325 MG PO TABS: 1.0000 | ORAL_TABLET | ORAL | Status: DC | PRN

## 2021-12-16 MED ORDER — OXYTOCIN 10 UNIT/ML IJ SOLN
INTRAMUSCULAR | Status: AC
  Filled 2021-12-16: qty 1

## 2021-12-16 MED ORDER — OXYTOCIN-SODIUM CHLORIDE 30-0.9 UT/500ML-% IV SOLN: 2.5000 [IU]/h | INTRAVENOUS | Status: DC

## 2021-12-16 MED ORDER — WITCH HAZEL-GLYCERIN EX PADS
1.0000 | MEDICATED_PAD | CUTANEOUS | Status: DC | PRN
  Administered 2021-12-16: 1 via TOPICAL

## 2021-12-16 MED ORDER — ONDANSETRON HCL 4 MG PO TABS: 4.0000 mg | ORAL_TABLET | ORAL | Status: DC | PRN

## 2021-12-16 MED ORDER — SIMETHICONE 80 MG PO CHEW: 80.0000 mg | CHEWABLE_TABLET | ORAL | Status: DC | PRN

## 2021-12-16 MED ORDER — DIPHENHYDRAMINE HCL 25 MG PO CAPS: 25.0000 mg | ORAL_CAPSULE | Freq: Four times a day (QID) | ORAL | Status: DC | PRN

## 2021-12-16 MED ORDER — SOD CITRATE-CITRIC ACID 500-334 MG/5ML PO SOLN: 30.0000 mL | ORAL | Status: DC | PRN

## 2021-12-16 NOTE — Progress Notes (Signed)
FHT Some variable decels UCs q2-3 min  I D/W patient my recommendation for IV access. I discussed that the IV is very important to resuscitate her and baby in case of distress. I told her I very strongly recommend IV access. She states that placing the IV would hurt and may slow down her labor. I pointed out that she is 9 cm and an IV would not slow her labor. She states she understands but refuses IV access.

## 2021-12-16 NOTE — H&P (Signed)
Denise Jennings is a 36 y.o. female presenting for UCs. Pregnancy complicated by late entry for [facility] @ 28 weeks. Pituitary adenoma and Hx of PCOS. OB History     Gravida  1   Para      Term      Preterm      AB      Living         SAB      IAB      Ectopic      Multiple      Live Births             Past Medical History:  Diagnosis Date   Pituitary microadenoma ([place])    No past surgical history on file. Family History: family history is not on file. Social History:  reports that she has never smoked. She has never used smokeless tobacco. She reports that she does not drink alcohol and does not use drugs.     Maternal Diabetes: No Genetic Screening: Declined Maternal Ultrasounds/Referrals: Normal Fetal Ultrasounds or other Referrals:  None Maternal Substance Abuse:  No Significant Maternal Medications:  None Significant Maternal Lab Results:  Group B Strep negative Other Comments:  None  Review of Systems  Eyes:  Negative for visual disturbance.   Maternal Medical History:  Reason for admission: Contractions.       Last menstrual period 03/02/2021. Maternal Exam:  Abdomen: Fetal presentation: vertex   Physical Exam Cardiovascular:     Rate and Rhythm: Normal rate.  Pulmonary:     Effort: Pulmonary effort is normal.    Cx 9 cm with intact BOW per nurse check in MAU Prenatal labs: ABO, Rh: O/Positive/-- (04/05 0000) Antibody: Negative (04/05 0000) Rubella: Immune (04/05 0000) RPR: Nonreactive (04/05 0000)  HBsAg: Negative (04/05 0000)  HIV: Non-reactive (04/05 0000)  GBS: Negative/-- (06/05 0000)   Assessment/Plan: 36 yo G1P0 in active labor Anticipate vaginal delivery   Shon Millet II 12/16/2021, 12:24 AM

## 2021-12-16 NOTE — Progress Notes (Signed)
Delivery Note At 2:41 AM a viable female was delivered via Vaginal, Spontaneous (Presentation: OA     ).  APGAR: 9, 9; weight  .   Placenta status: Spontaneous, Intact.  Cord: 3 vessels with the following complications: None.  Cord pH:   Anesthesia: None Episiotomy: None Lacerations: 2nd degree ML lac repaired Suture Repair: 2.0 vicryl rapide Est. Blood Loss (mL):  100  Per patient request the cord clamp was delayed until delivery of the placenta Patient declines pitocin>vigorous uterine massage after delivery of placenta and again after repairs>firm Mom to postpartum.  Baby to Couplet care / Skin to Skin.  Denise Jennings 12/16/2021, 3:13 AM

## 2021-12-16 NOTE — Progress Notes (Signed)
FHT 130s, two minute decel noted with recovery UCs q2-3 min Cx rim on left/C/+1 BOWI She declines AROM to check color of fluid

## 2021-12-16 NOTE — Progress Notes (Signed)
FHT 140s Pushing in knee chest  I am just informed patient refuses any blood draws for labs. I D/W patient. She does not want any labs done. I told her this means if she has life threatening bleeding I am unable to cross match her for a transfusion and she could bleed to death.  After discussion with the nurse she now states she will allow CBC and T&S.

## 2021-12-16 NOTE — Lactation Note (Signed)
This note was copied from a baby's chart. Lactation Consultation Note Mom called wanting Lactation to come see her. Baby didn't latch in L&D. Mom baby baby wasn't interested and was sleepy. Woke up baby and used gloved finger for stimulation to get baby to suck then switched him to the breast. Needed lips flanged several times. Baby tongue thrust some, noted helpful. Newborn feeding habits, STS, I&O, supply and demand. Mom encouraged to feed baby 8-12 times/24 hours and with feeding cues.   Hand expression taught w/thick colostrum noted. Baby fell asleep BF. Mom sleepy now wanting to rest since baby is sleeping. Encouraged to call for assistance as needed.   Patient Name: Denise Jennings [id number]'I Date: 12/16/2021 Reason for consult: Initial assessment;Primapara;Term Age:75 hours  Maternal Data Has patient been taught Hand Expression?: Yes Does the patient have breastfeeding experience prior to this delivery?: No  Feeding    LATCH Score Latch: Repeated attempts needed to sustain latch, nipple held in mouth throughout feeding, stimulation needed to elicit sucking reflex.  Audible Swallowing: None  Type of Nipple: Everted at rest and after stimulation (short shaft)  Comfort (Breast/Nipple): Soft / non-tender  Hold (Positioning): Full assist, staff holds infant at breast  LATCH Score: 5   Lactation Tools Discussed/Used    Interventions Interventions: Breast feeding basics reviewed;Assisted with latch;Skin to skin;Breast massage;Hand express;Breast compression;Adjust position;Support pillows;Position options  Discharge    Consult Status Consult Status: Follow-up Date: 12/16/21 Follow-up type: In-patient    Theodoro Kalata 12/16/2021, 6:38 AM

## 2021-12-16 NOTE — Progress Notes (Signed)
Pt. Declines CBC this morning as she had one 4 hours prior and had an EBL of 100 ml. Would prefer tomorrow morning.

## 2021-12-16 NOTE — MAU Note (Signed)
Pt arrived to MAU in active labor. Pt is 9/100/-1 vertex. Expedited transfer to L&D. Fatima Blank CNM at bedside for Pt transfer.  FHR 150.

## 2021-12-17 LAB — CBC
HCT: 36.7 % (ref 36.0–46.0)
Hemoglobin: 12.1 g/dL (ref 12.0–15.0)
MCH: 30.8 pg (ref 26.0–34.0)
MCHC: 33 g/dL (ref 30.0–36.0)
MCV: 93.4 fL (ref 80.0–100.0)
Platelets: 170 10*3/uL (ref 150–400)
RBC: 3.93 MIL/uL (ref 3.87–5.11)
RDW: 12.9 % (ref 11.5–15.5)
WBC: 8.7 10*3/uL (ref 4.0–10.5)
nRBC: 0 % (ref 0.0–0.2)

## 2021-12-17 LAB — BIRTH TISSUE RECOVERY COLLECTION (PLACENTA DONATION)

## 2021-12-17 NOTE — Lactation Note (Signed)
This note was copied from a baby's chart. Lactation Consultation Note  Patient Name: Denise Jennings TOIZT'I Date: 12/17/2021 Reason for consult: Initial assessment;Primapara;1st time breastfeeding;Term Age:38 hours   P1 mother whose infant is now 70 hours old.  This is a term baby at 39+4 weeks.  Mother's current feeding preference is breast.  Mother did not have a lactation consult order; spoke with RN who placed order.  Baby "Bland Span" clothed and in bassinet.  Mother stated he was showing cues prior to my arrival.  Taught hand expression and finger fed colostrum drops.  Latched in the football hold per mother's request.  He took a few sucks and became frustrated.  Repeated attempts and then suggested mother try another hold.  Latched easier in the cross cradle hold and observed him on/off for 10 minutes.  He still remains sleepy.  Baby has voided/stooled.  Placed him STS and he fell asleep.  Suggested to parents that we may need to supplement after the next feeding if he does not awaken and feed better.  Parents verbalized understanding and in agreement with this plan.  Encouraged lots of STS, breast massage and hand expression.  Mother will finger feed any expressed drops she obtains to baby.  RN updated and will begin supplementation with the next feeding if baby does not awaken and feed well.    Father present and supportive.   Maternal Data Has patient been taught Hand Expression?: Yes Does the patient have breastfeeding experience prior to this delivery?: No  Feeding Mother's Current Feeding Choice: Breast Milk  LATCH Score Latch: Repeated attempts needed to sustain latch, nipple held in mouth throughout feeding, stimulation needed to elicit sucking reflex.  Audible Swallowing: None  Type of Nipple: Everted at rest and after stimulation  Comfort (Breast/Nipple): Soft / non-tender  Hold (Positioning): Assistance needed to correctly position infant at breast and maintain  latch.  LATCH Score: 6   Lactation Tools Discussed/Used    Interventions Interventions: Breast feeding basics reviewed;Assisted with latch;Skin to skin;Breast massage;Hand express;Breast compression;Expressed milk;Position options;Support pillows;Adjust position;Education;LC Services brochure  Discharge Pump: Personal  Consult Status Consult Status: Follow-up Date: 12/18/21 Follow-up type: In-patient    Martie Muhlbauer R Deniesha Stenglein 12/17/2021, 11:28 AM

## 2021-12-17 NOTE — Discharge Summary (Signed)
Postpartum Discharge Summary        Patient Name: Denise Jennings DOB: 15-Apr-1986 MRN: [id number]  Date of admission: 12/15/2021 Delivery date:12/16/2021  Delivering provider: Everlene Farrier  Date of discharge: 12/17/2021  Admitting diagnosis: Term pregnancy [Z34.90] Intrauterine pregnancy: [redacted]w[redacted]d     Secondary diagnosis:  Principal Problem:   Term pregnancy  Additional problems:      Discharge diagnosis: Term Pregnancy Delivered                                              Post partum procedures:   Augmentation: N/A Complications: None  Hospital course: Onset of Labor With Vaginal Delivery      36 y.o. yo G1P1001 at [redacted]w[redacted]d was admitted in Active Labor on 12/15/2021. Patient had an uncomplicated labor course as follows:  Membrane Rupture Time/Date: 2:40 AM ,12/16/2021   Delivery Method:Vaginal, Spontaneous  Episiotomy: None  Lacerations:  2nd degree  Patient had an uncomplicated postpartum course.  She is ambulating, tolerating a regular diet, passing flatus, and urinating well. Patient is discharged home in stable condition on 12/17/21.  Newborn Data: Birth date:12/16/2021  Birth time:2:41 AM  Gender:Female  Living status:Living  Apgars:9 ,9  Weight:3317 g   Magnesium Sulfate received: No BMZ received: No Rhophylac:N/A MMR:N/A T-DaP:   Flu: N/A Transfusion:No  Physical exam  Vitals:   12/16/21 1045 12/16/21 1430 12/16/21 2002 12/17/21 0500  BP: (!) 98/55 95/64 (!) 96/44 (!) 87/52  Pulse: 69 81 65 72  Resp: $Remo'17 16 16 16  'Sjwbi$ Temp: 98.1 F (36.7 C) 98 F (36.7 C) 98.5 F (36.9 C) 98.3 F (36.8 C)  TempSrc: Oral Oral Oral Oral  SpO2:   98% 98%   General: alert, cooperative, and no distress Lochia: appropriate Uterine Fundus: firm Incision: N/A DVT Evaluation:   Labs: Lab Results  Component Value Date   WBC 8.7 12/17/2021   HGB 12.1 12/17/2021   HCT 36.7 12/17/2021   MCV 93.4 12/17/2021   PLT 170 12/17/2021      Latest Ref Rng & Units 09/15/2021   10:02 AM   CMP  Glucose 70 - 99 mg/dL 69    [name] Score:     No data to display            After visit meds:  Allergies as of 12/17/2021       Reactions   Contrast Media [iodinated Contrast Media] Other (See Comments)   "Feels like needles pinching my body"        Medication List     TAKE these medications    cabergoline 0.5 MG tablet Commonly known as: DOSTINEX Take 0.5 tablets (0.25 mg total) by mouth 2 (two) times a week.         Discharge home in stable condition Infant Feeding: Breast Infant Disposition:home with mother Discharge instruction: per After Visit Summary and Postpartum booklet. Activity: Advance as tolerated. Pelvic rest for 6 weeks.  Diet: routine diet Anticipated Birth Control:    Postpartum Appointment:6 weeks Additional Postpartum F/U:    Future Appointments:No future appointments. Follow up Visit:      12/17/2021 Luz Lex, MD

## 2021-12-17 NOTE — Lactation Note (Signed)
This note was copied from a baby's chart. Lactation Consultation Note  Patient Name: Denise Jennings [id number] Date: 12/17/2021 Reason for consult: Initial assessment;Primapara;1st time breastfeeding;Term Age:38 hours   LC Note:  Mother requesting to wait until tonight or tomorrow to begin supplementation.  Spoke with pediatrician who agreed to wait until after the next feeding.  However, if baby does not feed well then supplementation is suggested.  Informed mother and she verbalized understanding.  Father not present at this time.   Maternal Data Has patient been taught Hand Expression?: Yes Does the patient have breastfeeding experience prior to this delivery?: No  Feeding Mother's Current Feeding Choice: Breast Milk  LATCH Score Latch: Repeated attempts needed to sustain latch, nipple held in mouth throughout feeding, stimulation needed to elicit sucking reflex.  Audible Swallowing: None  Type of Nipple: Everted at rest and after stimulation  Comfort (Breast/Nipple): Soft / non-tender  Hold (Positioning): Assistance needed to correctly position infant at breast and maintain latch.  LATCH Score: 6   Lactation Tools Discussed/Used    Interventions Interventions: Breast feeding basics reviewed;Assisted with latch;Skin to skin;Breast massage;Hand express;Breast compression;Expressed milk;Position options;Support pillows;Adjust position;Education;LC Services brochure  Discharge Pump: Personal  Consult Status Consult Status: Follow-up Date: 12/18/21 Follow-up type: In-patient    Cordney Barstow R Zymarion Favorite 12/17/2021, 12:08 PM

## 2021-12-17 NOTE — Lactation Note (Signed)
This note was copied from a baby's chart. Lactation Consultation Note  Patient Name: Denise Jennings BTDHR'C Date: 12/17/2021 Reason for consult: Follow-up assessment;RN request;Nipple pain/trauma Age:38 hours  LC assisted with latch in laid back position on left side.  Infant latched with mod. Amount of pain that subsided after several seconds.  Mom states baby has not eaten "like this" before.  Infant had good rhythmic sucking and a few swallows heard.   Hand expression yields a couple of drops.   Baby fell asleep after 10 minutes. Latching was more painful on the right side with slight smacking noise noted.This resolved with position change.  Again, a few swallows were heard with stimulation and breast compression.    LC suggested supplementation.  Mom prefer DBM.  LC recommended pumping in order to provide more stimulation and collect milk.   RN notified and will give DBM.    Praised mom for efforts and her baby now latching and actively eating.    Dad assisted in positioning and pillow support.    Maternal Data Has patient been taught Hand Expression?: Yes  Feeding Mother's Current Feeding Choice: Breast Milk  LATCH Score Latch: Repeated attempts needed to sustain latch, nipple held in mouth throughout feeding, stimulation needed to elicit sucking reflex.  Audible Swallowing: A few with stimulation  Type of Nipple: Everted at rest and after stimulation  Comfort (Breast/Nipple): Filling, red/small blisters or bruises, mild/mod discomfort (mod. pain with beginning of latch then mild)  Hold (Positioning): Assistance needed to correctly position infant at breast and maintain latch.  LATCH Score: 6   Lactation Tools Discussed/Used    Interventions Interventions: Breast feeding basics reviewed;Assisted with latch;Skin to skin;Hand express;Adjust position;Support pillows;Position options;Education  Discharge    Consult Status Consult Status: Follow-up Date:  12/18/21 Follow-up type: In-patient    Ferne Coe [facility] 12/17/2021, 4:51 PM

## 2021-12-17 NOTE — Social Work (Signed)
CSW received consult for [name] 10. CSW met with MOB to offer support and complete assessment.    CSW met with MOB at bedside and introduced CSW role. CSW observed MOB in the bed and FOB present at bedside. MOB presented calm and welcomed CSW visit. MOB gave CSW permission to share information with FOB present. CSW explained the reason for the visit, to discuss the [name]. MOB reported that she has been feeling pretty good however she is not producing enough breast milk to feed the infant. CSW provided active listing and provided support. CSW inquired about MOB supports during this time. MOB identified her spouse, parents and MIL as supports. MOB shared the labor and delivery went well being she had no epidural or IV access. CSW praised MOB for her efforts. MOB reported no mental health history. CSW discussed PPD. MOB shared that has relatives that experienced PPD, "they cried all the time.'   CSW provided education regarding the baby blues period vs. perinatal mood disorders, discussed treatment and gave resources for mental health follow up if concerns arise.  CSW recommended MOB complete a self-evaluation during the postpartum time period using the New Mom Checklist from Postpartum Progress and encouraged MOB to contact a medical professional if symptoms are noted at any time. CSW assessed MOB for safety. MOB denied thoughts of harm to self and others.   MOB reported she has essential items for the infant including a bassinet where the infant will sleep. CSW provided review of Sudden Infant Death Syndrome (SIDS) precautions.  MOB has chosen [facility] for the infant's follow up care. CSW assessed MOB for additional needs. MOB reported no further need.   CSW identifies no further need for intervention and no barriers to discharge at this time.   Denise Jennings, MSW, LCSW [facility]  Clinical Social Worker  [phone] 12/17/2021  3:03 PM  

## 2021-12-17 NOTE — Progress Notes (Signed)
Post Partum Day 1 Subjective: no complaints, up ad lib, voiding, and tolerating PO  Objective: Blood pressure (!) 87/52, pulse 72, temperature 98.3 F (36.8 C), temperature source Oral, resp. rate 16, last menstrual period 03/02/2021, SpO2 98 %, unknown if currently breastfeeding.  Physical Exam:  General: alert, cooperative, appears stated age, and no distress Lochia: appropriate Uterine Fundus: firm Incision:   DVT Evaluation: No evidence of DVT seen on physical exam.  Recent Labs    12/16/21 0140 12/17/21 0758  HGB 13.0 12.1  HCT 37.0 36.7    Assessment/Plan: Discharge home and Breastfeeding Declines Circ   LOS: 1 day   Luz Lex, MD 12/17/2021, 9:06 AM

## 2021-12-18 ENCOUNTER — Ambulatory Visit: Payer: Self-pay

## 2021-12-18 NOTE — Lactation Note (Signed)
This note was copied from a baby's chart. Lactation Consultation Note Mom has called out repeatedly for Lactation assistance tonight. LC went to see mom. Baby is sleepy not wanting to latch and when he does latch mom un-latches d/t pain. Fitted #20 NS mom stated better. Baby sleepy. Inserted 10 ml DM over the course of feeding. Baby BF well w/DM inserted w/curve tip syring. Suggested mom use DEBP after feeding for extra stimulation. Mom stated she would. Encouraged mom use DBM w/curve tip syring into NS to get baby latched if baby not interested, maybe baby need some calories. Very sleepy at 83 hrs old. No jitteriness noted. Report to RN if baby not interested in BF w/DBM.  Plan: Mom use NS for latching Insert DBM into NS w/curve tip syring After feeding post pump.  Patient Name: Boy Lakendra Helling FUXNA'T Date: 12/18/2021 Reason for consult: Mother's request;Primapara;Difficult latch;Term Age:38 hours  Maternal Data Has patient been taught Hand Expression?: Yes Does the patient have breastfeeding experience prior to this delivery?: No  Feeding Mother's Current Feeding Choice: Breast Milk and Donor Milk  LATCH Score Latch: Repeated attempts needed to sustain latch, nipple held in mouth throughout feeding, stimulation needed to elicit sucking reflex.  Audible Swallowing: None  Type of Nipple: Everted at rest and after stimulation  Comfort (Breast/Nipple): Soft / non-tender  Hold (Positioning): Full assist, staff holds infant at breast  LATCH Score: 5   Lactation Tools Discussed/Used Tools: Pump;Nipple Shields Nipple shield size: 20 Breast pump type: Double-Electric Breast Pump  Interventions Interventions: Breast feeding basics reviewed;Assisted with latch;Skin to skin;Breast massage;Hand express;Breast compression;Adjust position;Support pillows;Position options;DEBP  Discharge    Consult Status Consult Status: Follow-up Date: 12/18/21 Follow-up type:  In-patient    Theodoro Kalata 12/18/2021, 3:36 AM

## 2021-12-18 NOTE — Lactation Note (Signed)
This note was copied from a baby's chart. Lactation Consultation Note  Patient Name: Denise Jennings [id number] Date: 12/18/2021 Reason for consult: Follow-up assessment;1st time breastfeeding;Primapara;Term Age:38 hours   P1 mother whose infant is now 65 hours old.  This is a term baby at 39+4 weeks.  Mother's current feeding preference is breast/donor breast milk.  "Bland Span" was asleep on mother's chest when I arrived.  He continues to be quite sleepy; no feeding cues noted.  Offered to assist with waking; mother receptive.  Demonstrated techniques to help awaken him.  Assisted to latch to the left breast in the football hold.  He required constant stimulation to begin sucking and to continue sucking.  Asked parents to be firming rub him during feedings or else he will continue to sleep at the breast and not receive his nourishment.  Showed mother how to properly hold him for effective latching.  Mother excited when she noticed him beginning to feed well; intermittent swallows heard.  Suggested to father that he will probably need to assist mother with keeping "Bland Span" actively feeding at discharge until he begins to awaken and feed better.  Father will assist.  Suggested parents continue to supplement after breast feeding with 30+ mls until he is breast feeding better.  Demonstrated paced feeding.  "Bland Span" had no difficulty with bottle feeding using the slow flow nipple; burped well.  He immediately fell asleep after his feeding.  Parents pleased.  RN updated.  Family has been discharged.  Discussed with  parents how to make an OP visit or call for questions if needed.   Maternal Data    Feeding Mother's Current Feeding Choice: Breast Milk and Donor Milk Nipple Type: Slow - flow  LATCH Score Latch: Repeated attempts needed to sustain latch, nipple held in mouth throughout feeding, stimulation needed to elicit sucking reflex.  Audible Swallowing: Spontaneous and intermittent  Type of Nipple:  Everted at rest and after stimulation  Comfort (Breast/Nipple): Soft / non-tender  Hold (Positioning): Assistance needed to correctly position infant at breast and maintain latch.  LATCH Score: 8   Lactation Tools Discussed/Used    Interventions Interventions: Breast feeding basics reviewed;Assisted with latch;Skin to skin;Breast massage;Hand express;Breast compression;Expressed milk;Position options;Support pillows;Adjust position;Education  Discharge Pump: Personal  Consult Status Consult Status: Complete Date: 12/18/21 Follow-up type: Call as needed    Pauline Trainer R Janira Mandell 12/18/2021, 3:28 PM

## 2021-12-20 ENCOUNTER — Telehealth (HOSPITAL_COMMUNITY): Payer: Self-pay | Admitting: *Deleted

## 2021-12-20 NOTE — Telephone Encounter (Signed)
Dr. Corinna Capra notified of epds score during hospital stay of 10.   Odis Hollingshead, RN 12-20-2021 at 2:27pm

## 2021-12-27 ENCOUNTER — Inpatient Hospital Stay (HOSPITAL_COMMUNITY): Payer: 59

## 2021-12-27 ENCOUNTER — Inpatient Hospital Stay (HOSPITAL_COMMUNITY): Admission: AD | Admit: 2021-12-27 | Payer: 59 | Source: Home / Self Care | Admitting: Obstetrics and Gynecology

## 2022-01-24 DIAGNOSIS — Z309 Encounter for contraceptive management, unspecified: Secondary | ICD-10-CM | POA: Diagnosis not present

## 2022-01-24 DIAGNOSIS — D352 Benign neoplasm of pituitary gland: Secondary | ICD-10-CM | POA: Diagnosis not present

## 2022-01-24 DIAGNOSIS — Z1389 Encounter for screening for other disorder: Secondary | ICD-10-CM | POA: Diagnosis not present

## 2022-01-27 DIAGNOSIS — M25562 Pain in left knee: Secondary | ICD-10-CM | POA: Diagnosis not present

## 2022-02-09 DIAGNOSIS — M25562 Pain in left knee: Secondary | ICD-10-CM | POA: Diagnosis not present

## 2022-03-02 DIAGNOSIS — M25362 Other instability, left knee: Secondary | ICD-10-CM | POA: Diagnosis not present

## 2022-03-02 DIAGNOSIS — M2202 Recurrent dislocation of patella, left knee: Secondary | ICD-10-CM | POA: Diagnosis not present

## 2022-03-02 DIAGNOSIS — R531 Weakness: Secondary | ICD-10-CM | POA: Diagnosis not present

## 2022-03-02 DIAGNOSIS — M25562 Pain in left knee: Secondary | ICD-10-CM | POA: Diagnosis not present

## 2022-04-26 ENCOUNTER — Ambulatory Visit: Payer: 59 | Admitting: Internal Medicine

## 2022-04-26 ENCOUNTER — Encounter: Payer: Self-pay | Admitting: Internal Medicine

## 2022-04-26 VITALS — BP 110/68 | HR 51 | Ht 63.0 in | Wt 139.2 lb

## 2022-04-26 DIAGNOSIS — E282 Polycystic ovarian syndrome: Secondary | ICD-10-CM | POA: Diagnosis not present

## 2022-04-26 DIAGNOSIS — D352 Benign neoplasm of pituitary gland: Secondary | ICD-10-CM | POA: Diagnosis not present

## 2022-04-26 NOTE — Patient Instructions (Signed)
Please come back for labs in ~6 months after stopping b'feeding.  Come back for a visit ~2 weeks later.

## 2022-04-26 NOTE — Progress Notes (Signed)
Patient ID: Denise LINGERFELT, female   DOB: Mar 09, 1986, 36 y.o.   MRN: [id number]  HPI: Denise Jennings is a 36 y.o. 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  Patient describes that she had 2 menstrual cycles after menarche, after which she developed secondary amenorrhea.  Further investigation revealed polycystic ovaries, and, reportedly, high testosterone level.  She was diagnosed with PCOS and started on oral contraceptives.    In 2021, she started a [place] herbal tea, which she took fairly consistently for a year.  She had menstrual cycles approximately every 2 months during this time.  She then stopped to see if her menstrual cycles resumed spontaneously, but they did not.  At that time, she presented to see OB/GYN and the prolactin level was high (I do not have these records).  She was referred to Dr. Loanne Drilling, whom she saw in 2022.  Pituitary MRI was checked and it showed a 4 mm microadenoma.  She was started on Cabergoline, which she continued for 26 weeks.  However, she was then found to be pregnant -[redacted] weeks along.  At that time she stopped Cabergoline.  She had a uneventful pregnancy and gave birth on 12/16/2021-healthy boy.  She is currently breast-feeding but she does not have a large supply (she feels that she only has approximately 50% of the history supply) so she supplements with formula.  She is not trying to wean her baby off.  Reviewed previous investigation: Pituitary MRI (11/29/2020): 4 mm area of delayed enhancement along the far right side of the gland. In association with slight leftward deviation of the pituitary stalk, this is suggestive of a pituitary microadenoma.  Prolactin levels available for review was normal: Lab Results  Component Value Date   PROLACTIN 11.9 09/15/2021   PROLACTIN 6.0 04/07/2021   Otherwise, pituitary hormone evaluation was normal: Component     Latest Ref Rng 09/15/2021  FSH     mIU/ML 0.1   LH     mIU/mL 0.67   TSH      0.35 - 5.50 uIU/mL 1.06    Fertility/Menstrual cycles: - she had 2 menses after menarche >> then had amenorrhea  -currently breast-feeding - + h/o ovarian cysts - U/S while a teenager - children: 1 - miscarriages: 0 - contraception: none now  Acne: - no  Hirsutism: - no  Weight gain: - no - no steroid use - no weight loss meds - Exercise:  none  Treatments tried: - did not try Metformin - did not try Spironolactone - did not try Vaniqa - prev. OCPs - prev. chinese herbal tea  - Last HbA1c was normal: Lab Results  Component Value Date   HGBA1C 5.0 09/15/2021   Paternal aunt has PCOS. She does not have family history of pituitary tumors,  infertility.  ROS: + see HPI  Past Medical History:  Diagnosis Date   Pituitary microadenoma ([place])    No past surgical history on file. Social History   Socioeconomic History   Marital status: Married    Spouse name: Not on file   Number of children: Not on file   Years of education: Not on file   Highest education level: Not on file  Occupational History   Not on file  Tobacco Use   Smoking status: Never   Smokeless tobacco: Never  Vaping Use   Vaping Use: Never used  Substance and Sexual Activity   Alcohol use: Never   Drug use: Never   Sexual activity:  Yes    Birth control/protection: Pill  Other Topics Concern   Not on file  Social History Narrative   Not on file   Social Determinants of Health   Financial Resource Strain: Not on file  Food Insecurity: Not on file  Transportation Needs: Not on file  Physical Activity: Not on file  Stress: Not on file  Social Connections: Not on file  Intimate Partner Violence: Not on file   Current Outpatient Medications on File Prior to Visit  Medication Sig Dispense Refill   cabergoline (DOSTINEX) 0.5 MG tablet Take 0.5 tablets (0.25 mg total) by mouth 2 (two) times a week. 12 tablet 3   No current facility-administered medications on file prior to visit.    Allergies  Allergen Reactions   Contrast Media [Iodinated Contrast Media] Other (See Comments)    "Feels like needles pinching my body"   Family History  Problem Relation Age of Onset   Other Neg Hx    PE: BP 110/68 (BP Location: Left Arm, Patient Position: Sitting, Cuff Size: Normal)   Pulse (!) 51   Ht '5\' 3"'$  (1.6 m)   Wt 139 lb 3.2 oz (63.1 kg)   SpO2 98%   BMI 24.66 kg/m  Wt Readings from Last 3 Encounters:  04/26/22 139 lb 3.2 oz (63.1 kg)  09/15/21 146 lb 6.4 oz (66.4 kg)  03/08/21 142 lb 6.4 oz (64.6 kg)   Constitutional: normal weight, in NAD, no full supraclavicular fat pads Eyes: no exophthalmos ENT: no thyromegaly, no cervical lymphadenopathy Cardiovascular: Bradycardia, RR, No MRG Respiratory: CTA B Musculoskeletal: no deformities Skin: no acne on face, no dark terminal hair on face, no vellum on sideburns, no skin tags, no acanthosis nigricans, no purple, wide, stretch marks Neurological: no tremor with outstretched hands  ASSESSMENT: 1. PCOS  2.  Pituitary microadenoma  PLAN: 1. PCOS - I do not have previous work-up for this, but per her report, this was diagnosed when she was a teenager and had absent menstrual cycles after menarche, polycystic ovaries on ultrasound and high testosterone level. - I had a long discussion with the patient about the fact that the PCOS is a misnomer, a patient does not necessarily have to have polycystic ovaries to be diagnosed with the disorder but this can be a feature of the syndrome. The PCOS syndrome is caused by a dysfunction in the pituitary gonadal pulse generator in which LH and [place] hormones are secreted in pulses with an abnormal frequency.  As a consequence, patients may have an excess of estrogen and testosterone. - These hormonal abnormalities can result in a sum of several conditions, including: weight gain insulin resistance (and therefore a higher risk of developing diabetes later in  life) acne hirsutism irregular menstrual cycles decreased fertility. - We also discussed about the fact that the treatment is usually targeted to addressing the problem that concerns the patient the most: acne/hirsutism, weight gain, or fertility, but there is no single treatment for PCOS.  - The first-line therapy are oral contraceptives. If patients are concerned with her weight, we can use metformin; they are concerned about acne/hirsutism, we can add spironolactone; and if they are concerned about fertility, a referral to reproductive endocrinology may be necessary. -In her case, she had a successful pregnancy after using a Chinese herbal tea (she was not sure of the ingredients) and she is now successfully breast-feeding (although only having 50% of the necessary supply).  She would like to wait for few months after she  finishes breast-feeding to see if her menstrual cycles will return spontaneously, and only then to start OCPs if needed. -We discussed about having her come back approximately after 6 months after she finishes breast-feeding for checking a testosterone, DHEA-S, 17 hydroxyprogesterone, and HbA1c.  2.  Pituitary microadenoma -Measuring 4 mm on MRI from 2022 -I do not have records of elevated prolactin, but this was mentioned in Dr. Cordelia Pen note >> she was started on Cabergoline 0.25 mg twice a day >> however, retrospectively, she was actually pregnant at the time of the high prolactin detection so she was on Cabergoline for much of her pregnancy.  She stopped this as soon as she found out that she was pregnant.  She continues off the medication now during breast-feeding.  We discussed at today's visit that Cabergoline is actually a safe medication during pregnancy but it has not been investigated in formal studies and this is the reason why it is not recommended for pregnancy. -We discussed about the fact that her high prolactin could have been related to the pregnancy and not  necessarily to the pituitary microadenoma. -At last check, select pituitary labs were normal: Component     Latest Ref Rng 09/15/2021  FSH     mIU/ML 0.1   LH     mIU/mL 0.67   Prolactin     ng/mL 11.9   TSH     0.35 - 5.50 uIU/mL 1.06   -I plan to repeat prolactin, and also check TFTs, ACTH/cortisol, IGF-I -6 months after she stops breast-feeding  Orders Placed This Encounter  Procedures   TSH   Testosterone Free with SHBG   Prolactin   Insulin-like growth factor   Luteinizing hormone   Follicle stimulating hormone   Growth hormone   T3, free   T4, free   DHEA-Sulfate, Serum   Cortisol   Beta hCG quant (ref lab)   ACTH   17-Hydroxyprogesterone   Hemoglobin A1c   - Total time spent for the visit: 40 min, in precharting, reviewing Dr. Cordelia Pen last note, obtaining medical information from the chart and from the pt, reviewing her  previous labs, evaluations, and treatments, reviewing her symptoms, counseling her about her endocrine condtions (please see the discussed topics above), and developing a plan to further investigate and treat them.  Philemon Kingdom, MD PhD [facility] Endocrinology
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
## 2022-06-23 DIAGNOSIS — Z23 Encounter for immunization: Secondary | ICD-10-CM | POA: Diagnosis not present

## 2022-09-05 DIAGNOSIS — E611 Iron deficiency: Secondary | ICD-10-CM | POA: Diagnosis not present

## 2022-09-05 DIAGNOSIS — E538 Deficiency of other specified B group vitamins: Secondary | ICD-10-CM | POA: Diagnosis not present

## 2022-09-05 DIAGNOSIS — R5382 Chronic fatigue, unspecified: Secondary | ICD-10-CM | POA: Diagnosis not present

## 2022-09-05 DIAGNOSIS — Z6825 Body mass index (BMI) 25.0-25.9, adult: Secondary | ICD-10-CM | POA: Diagnosis not present

## 2022-09-05 DIAGNOSIS — D352 Benign neoplasm of pituitary gland: Secondary | ICD-10-CM | POA: Diagnosis not present

## 2022-09-05 DIAGNOSIS — E7800 Pure hypercholesterolemia, unspecified: Secondary | ICD-10-CM | POA: Diagnosis not present

## 2022-09-05 DIAGNOSIS — Z8781 Personal history of (healed) traumatic fracture: Secondary | ICD-10-CM | POA: Diagnosis not present

## 2022-09-19 DIAGNOSIS — Z6825 Body mass index (BMI) 25.0-25.9, adult: Secondary | ICD-10-CM | POA: Diagnosis not present

## 2022-09-19 DIAGNOSIS — E611 Iron deficiency: Secondary | ICD-10-CM | POA: Diagnosis not present

## 2022-09-19 DIAGNOSIS — E538 Deficiency of other specified B group vitamins: Secondary | ICD-10-CM | POA: Diagnosis not present

## 2022-09-19 DIAGNOSIS — E7800 Pure hypercholesterolemia, unspecified: Secondary | ICD-10-CM | POA: Diagnosis not present

## 2022-09-19 DIAGNOSIS — D352 Benign neoplasm of pituitary gland: Secondary | ICD-10-CM | POA: Diagnosis not present

## 2022-09-30 DIAGNOSIS — H5213 Myopia, bilateral: Secondary | ICD-10-CM | POA: Diagnosis not present

## 2022-10-25 ENCOUNTER — Other Ambulatory Visit: Payer: 59

## 2022-10-25 ENCOUNTER — Ambulatory Visit: Payer: 59 | Admitting: Internal Medicine

## 2022-11-08 ENCOUNTER — Ambulatory Visit: Payer: 59 | Admitting: Internal Medicine

## 2022-11-25 ENCOUNTER — Other Ambulatory Visit: Payer: 59

## 2022-12-09 ENCOUNTER — Ambulatory Visit: Payer: 59 | Admitting: Internal Medicine

## 2023-02-14 ENCOUNTER — Ambulatory Visit: Payer: 59 | Admitting: Internal Medicine

## 2023-02-21 ENCOUNTER — Encounter: Payer: Self-pay | Admitting: Internal Medicine

## 2023-02-21 ENCOUNTER — Ambulatory Visit: Payer: Commercial Managed Care - PPO | Admitting: Internal Medicine

## 2023-02-21 VITALS — BP 104/70 | HR 53 | Ht 63.0 in | Wt 139.8 lb

## 2023-02-21 DIAGNOSIS — D352 Benign neoplasm of pituitary gland: Secondary | ICD-10-CM | POA: Diagnosis not present

## 2023-02-21 DIAGNOSIS — E282 Polycystic ovarian syndrome: Secondary | ICD-10-CM | POA: Diagnosis not present

## 2023-02-21 NOTE — Patient Instructions (Addendum)
Please come for labs in am, fasting.  Please return in 1 year.

## 2023-02-21 NOTE — Progress Notes (Addendum)
Patient ID: Denise Jennings Jennings, female   DOB: 1985/11/21, 37 y.o.   MRN: [id number]  HPI: Denise Jennings Jennings is a 37 y.o. 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  Last visit 10 months ago.  Before our first visit in 04/2022: Patient described that she had 2 menstrual cycles after menarche, after which she developed secondary amenorrhea.  Further investigation revealed polycystic ovaries, and, reportedly, high testosterone level.  She was diagnosed with PCOS and started on oral contraceptives.    In 2021, she started a [place] herbal tea, which she took fairly consistently for a year.  She had menstrual cycles approximately every 2 months during this time.  She then stopped to see if her menstrual cycles resumed spontaneously, but they did not.  At that time, she presented to see OB/GYN and the prolactin level was high (I do not have these records).  She was referred to Dr. Everardo All, whom she saw in 2022.  Pituitary MRI was checked and it showed a 4 mm microadenoma.  She was started on Cabergoline, which she continued for 26 weeks.  However, she was then found to be pregnant -[redacted] weeks along.  At that time she stopped Cabergoline.  She had a uneventful pregnancy and gave birth on 12/16/2021-healthy boy.  She is currently breast-feeding but she does not have a large supply (she feels that she only has approximately 50% of the history supply) so she supplements with formula.  She was trying to wean her baby off.  At our initial visit:  She was breast-feeding so we could not check prolactin and the rest of the pituitary tests.  I ordered these and advised her to come back after she finished breast-feeding her son.  At this visit:  She finished breast-feeding 03/2022.  She feels well, without vision problems (except dry eyes), but still galactorrhea with stimulation only. She has occasional HAs. Some memory pbs, which she feels are due to fatigue. She still wakes up at night with the baby (67  months old). Her menstrual cycles are coming 1 mo apart, occasionally lighter, but much more regular than before pregnancy.  Reviewed previous investigation: Pituitary MRI (11/29/2020): 4 mm area of delayed enhancement along the far right side of the gland. In association with slight leftward deviation of the pituitary stalk, this is suggestive of a pituitary microadenoma.  Prolactin levels available for review was normal: Lab Results  Component Value Date   PROLACTIN 11.9 09/15/2021   PROLACTIN 6.0 04/07/2021   Otherwise, pituitary hormone evaluation was normal: Component     Latest Ref Rng 09/15/2021  FSH     mIU/ML 0.1   LH     mIU/mL 0.67   TSH     0.35 - 5.50 uIU/mL 1.06    Fertility/Menstrual cycles: - she had 2 menses after menarche >> then breast-feeding >> now regular postpartum - + h/o ovarian cysts - U/S while a teenager - children: 1 - miscarriages: 0  Acne: - no  Hirsutism: - no  Weight gain: - no - no steroid use - no weight loss meds - Exercise:  none  Treatments tried: - did not try Metformin - did not try Spironolactone - did not try Vaniqa - prev. OCPs - prev. chinese herbal tea  - Last HbA1c was normal: Lab Results  Component Value Date   HGBA1C 5.0 09/15/2021   Paternal aunt has PCOS. She does not have family history of pituitary tumors,  infertility.  Now still on prenatals  and probiotics.  ROS: + see HPI  Past Medical History:  Diagnosis Date   Pituitary microadenoma ([facility])    No past surgical history on file. Social History   Socioeconomic History   Marital status: Married    Spouse name: Not on file   Number of children: Not on file   Years of education: Not on file   Highest education level: Not on file  Occupational History   Not on file  Tobacco Use   Smoking status: Never   Smokeless tobacco: Never  Vaping Use   Vaping status: Never Used  Substance and Sexual Activity   Alcohol use: Never   Drug use: Never    Sexual activity: Yes    Birth control/protection: Pill  Other Topics Concern   Not on file  Social History Narrative   Not on file   Social Determinants of Health   Financial Resource Strain: Not on file  Food Insecurity: Not on file  Transportation Needs: Not on file  Physical Activity: Not on file  Stress: Not on file  Social Connections: Not on file  Intimate Partner Violence: Not on file   Current Outpatient Medications on File Prior to Visit  Medication Sig Dispense Refill   cabergoline (DOSTINEX) 0.5 MG tablet Take 0.5 tablets (0.25 mg total) by mouth 2 (two) times a week. (Patient not taking: Reported on 04/26/2022) 12 tablet 3   No current facility-administered medications on file prior to visit.   Allergies  Allergen Reactions   Contrast Media [Iodinated Contrast Media] Other (See Comments)    "Feels like needles pinching my body"   Family History  Problem Relation Age of Onset   Other Neg Hx    PE: BP 104/70   Pulse (!) 53   Ht 5\' 3"  (1.6 m)   Wt 139 lb 12.8 oz (63.4 kg)   SpO2 99%   BMI 24.76 kg/m  Wt Readings from Last 3 Encounters:  02/21/23 139 lb 12.8 oz (63.4 kg)  04/26/22 139 lb 3.2 oz (63.1 kg)  09/15/21 146 lb 6.4 oz (66.4 kg)   Constitutional: normal weight, in NAD, no full supraclavicular fat pads Eyes: no exophthalmos ENT: no thyromegaly, no cervical lymphadenopathy Cardiovascular: Bradycardia, RR, No MRG Respiratory: CTA B Musculoskeletal: no deformities Skin: no acne on face, no dark terminal hair on face, no vellum on sideburns, no skin tags, no acanthosis nigricans Neurological: no tremor with outstretched hands  ASSESSMENT: 1. PCOS  2.  Pituitary microadenoma  PLAN: 1. PCOS -Patient with a history of PCOS, diagnosed while she was a teenager and had absent menstrual cycles after menarche, polycystic ovaries on ultrasound, and high testosterone level. -We discussed at last visit about the etiology of PCOS -dysfunction and the  pituitary gonadal pulse generator in which FSH and LH hormones are secreted impulses with abnormal frequency.  As a consequence, patient may have an excessive estrogen and testosterone.  We also discussed that patients with PCOS are at higher risk for weight gain, insulin resistance and therefore the risk of diabetes later in life, acne, hirsutism, irregular menstrual cycles, and decreased fertility.  There is no unifying treatment for PCOS but oral contraceptives are first-line, metformin can be used for patient is concerned about weight, and spironolactone can be used for persistent acne/use exam after addition of OCPs.  If patient are concerned with fertility, referral to reproductive endocrinology may be necessary.  In case, she had a successful pregnancy after using a [place] herbal tea (she was not  sure about the ingredients) and she was successfully breast-feeding at last visit.  She wanted to wait for few months after finishing breast-feeding to see if her menstrual cycles 1 to return spontaneously and only then to start back on OCP as needed -At today's visit, we discussed about checking testosterone, DHEA-S, 17 hydroxyprogesterone and HbA1c  2.  Pituitary microadenoma -On the MRI from 2022, this was measuring 4 mm -I do not have records of elevated prolactin, but this was mentioned in Dr. George Hugh note.  She was started on Cabergoline 0.25 mg twice a day, however, retrospectively, she was actually pregnant at the time of the high prolactin detection so she was on Cabergoline during pregnancy.  She stopped this as soon as she found out that she was pregnant.  At last visit, she was off the medication during breast-feeding.  - We did discuss at last visit that Cabergoline is absolutely a safe medication for pregnancy but it was not formally investigated and this is the reason why it is not recommended for pregnancy, in favor of bromocriptine. -It does appear that her high prolactin was actually  related to pregnancy, and not to the microadenoma -Pituitary labs obtained 08/2021 (FSH, LH, prolactin, TSH) were actually normal -At last visit, I was planning to recheck her pituitary labs but we discussed about waiting 6 months after stopping breast-feeding to check these.  She now finished breast-feeding more than 6 months ago so we can recheck the test. Labs already pending in the system are:  -At today's visit, she does complain of increased fatigue, but she wakes up with the baby during the night.  She also has some headaches possibly due to fatigue and some galactorrhea to stimulation only.  We discussed that this is not always pathologic.  The fact that she does have regular menstrual cycles points towards a normal or slightly elevated prolactin. -I will see her back in a year but we will be in touch about the above results; after the results are back, we will also go ahead and repeat her pituitary MRI  Component     Latest Ref Rng 02/22/2023  Prolactin     ng/mL 14.0   Hemoglobin A1C     4.6 - 6.5 % 5.7   TSH     0.35 - 5.50 uIU/mL 0.75   FSH     mIU/ML 7.5   LH     mIU/mL 8.46   T4,Free(Direct)     0.60 - 1.60 ng/dL 1.61   Cortisol, Plasma     ug/dL 09.6   Triiodothyronine,Free,Serum     2.3 - 4.2 pg/mL 3.1   17-OH-Progesterone, LC/MS/MS      ng/dL 63   E454 ACTH     6 - 50 pg/mL 7   Growth Hormone     < OR = 7.1 ng/mL 4.4   IGF-I, LC/MS     53 - 331 ng/mL 199   Z-Score (Female)     -2.0 - 2.0 SD 0.7   hCG Quant     mIU/mL <1   DHEA-Sulfate, LCMS     ug/dL 43   Testosterone, Serum (Total)     ng/dL 23   % Free Testosterone     % 1.4   Free Testosterone, S     pg/mL 3.2   Sex Hormone Binding Globulin     nmol/L 46.5   HbA1c borderline between normal and prediabetes.  Otherwise, all other normal. I did recommend to improve diet by reducing  calorie dense foods, fatty foods, and concentrated sweets and increase fiber.  I also recommended consistent  exercise.  Carlus Pavlov, MD PhD [facility] Endocrinology

## 2023-02-22 ENCOUNTER — Other Ambulatory Visit (INDEPENDENT_AMBULATORY_CARE_PROVIDER_SITE_OTHER): Payer: Commercial Managed Care - PPO

## 2023-02-22 DIAGNOSIS — E282 Polycystic ovarian syndrome: Secondary | ICD-10-CM

## 2023-02-22 DIAGNOSIS — D352 Benign neoplasm of pituitary gland: Secondary | ICD-10-CM | POA: Diagnosis not present

## 2023-02-22 LAB — T3, FREE: T3, Free: 3.1 pg/mL (ref 2.3–4.2)

## 2023-02-22 LAB — TSH: TSH: 0.75 u[IU]/mL (ref 0.35–5.50)

## 2023-02-22 LAB — FOLLICLE STIMULATING HORMONE: FSH: 7.5 m[IU]/mL

## 2023-02-22 LAB — HEMOGLOBIN A1C: Hgb A1c MFr Bld: 5.7 % (ref 4.6–6.5)

## 2023-02-22 LAB — CORTISOL: Cortisol, Plasma: 13.5 ug/dL

## 2023-02-22 LAB — LUTEINIZING HORMONE: LH: 8.46 m[IU]/mL

## 2023-02-22 LAB — T4, FREE: Free T4: 0.89 ng/dL (ref 0.60–1.60)

## 2023-02-23 DIAGNOSIS — M545 Low back pain: Secondary | ICD-10-CM | POA: Diagnosis not present

## 2023-02-23 DIAGNOSIS — M62838 Other muscle spasm: Secondary | ICD-10-CM | POA: Diagnosis not present

## 2023-02-23 DIAGNOSIS — M6281 Muscle weakness (generalized): Secondary | ICD-10-CM | POA: Diagnosis not present

## 2023-03-02 LAB — GROWTH HORMONE: Growth Hormone: 4.4 ng/mL (ref <=7.1)

## 2023-03-02 LAB — 17-HYDROXYPROGESTERONE: 17-OH-Progesterone, LC/MS/MS: 63 ng/dL

## 2023-03-02 LAB — PROLACTIN: Prolactin: 14 ng/mL

## 2023-03-02 LAB — INSULIN-LIKE GROWTH FACTOR
IGF-I, LC/MS: 199 ng/mL (ref 53–331)
Z-Score (Female): 0.7 {STDV} (ref ?–2.0)

## 2023-03-02 LAB — ACTH: C206 ACTH: 7 pg/mL (ref 6–50)

## 2023-03-03 LAB — TESTOSTERONE, FREE AND TOTAL (INCLUDES SHBG)-(MALES)
% Free Testosterone: 1.4 %
Free Testosterone, S: 3.2 pg/mL
Sex Hormone Binding Globulin: 46.5 nmol/L
Testosterone, Serum (Total): 23 ng/dL

## 2023-03-03 LAB — DHEA-SULFATE, SERUM: DHEA-Sulfate, LCMS: 43 ug/dL

## 2023-03-03 LAB — BETA HCG QUANT (REF LAB): hCG Quant: <1 m[IU]/mL

## 2023-04-19 ENCOUNTER — Ambulatory Visit: Payer: 59 | Admitting: Internal Medicine

## 2023-05-31 DIAGNOSIS — Z13220 Encounter for screening for lipoid disorders: Secondary | ICD-10-CM | POA: Diagnosis not present

## 2023-05-31 DIAGNOSIS — Z0000 Encounter for general adult medical examination without abnormal findings: Secondary | ICD-10-CM | POA: Diagnosis not present

## 2023-05-31 DIAGNOSIS — Z13228 Encounter for screening for other metabolic disorders: Secondary | ICD-10-CM | POA: Diagnosis not present

## 2023-05-31 DIAGNOSIS — Z1329 Encounter for screening for other suspected endocrine disorder: Secondary | ICD-10-CM | POA: Diagnosis not present

## 2023-05-31 DIAGNOSIS — L309 Dermatitis, unspecified: Secondary | ICD-10-CM | POA: Diagnosis not present

## 2023-05-31 DIAGNOSIS — Z131 Encounter for screening for diabetes mellitus: Secondary | ICD-10-CM | POA: Diagnosis not present

## 2023-06-12 ENCOUNTER — Ambulatory Visit: Payer: 59 | Admitting: Internal Medicine

## 2023-06-19 DIAGNOSIS — Z1331 Encounter for screening for depression: Secondary | ICD-10-CM | POA: Diagnosis not present

## 2023-06-19 DIAGNOSIS — Z01411 Encounter for gynecological examination (general) (routine) with abnormal findings: Secondary | ICD-10-CM | POA: Diagnosis not present

## 2023-06-19 DIAGNOSIS — Z01419 Encounter for gynecological examination (general) (routine) without abnormal findings: Secondary | ICD-10-CM | POA: Diagnosis not present

## 2023-06-19 DIAGNOSIS — E282 Polycystic ovarian syndrome: Secondary | ICD-10-CM | POA: Diagnosis not present

## 2024-02-21 ENCOUNTER — Ambulatory Visit: Payer: Commercial Managed Care - PPO | Admitting: Internal Medicine

## 2024-02-22 ENCOUNTER — Encounter: Payer: Self-pay | Admitting: Internal Medicine

## 2024-03-14 ENCOUNTER — Ambulatory Visit: Admitting: Internal Medicine

## 2024-03-14 ENCOUNTER — Encounter: Payer: Self-pay | Admitting: Internal Medicine

## 2024-03-14 VITALS — BP 100/80 | HR 74 | Ht 63.0 in | Wt 130.0 lb

## 2024-03-14 DIAGNOSIS — E282 Polycystic ovarian syndrome: Secondary | ICD-10-CM | POA: Diagnosis not present

## 2024-03-14 DIAGNOSIS — D352 Benign neoplasm of pituitary gland: Secondary | ICD-10-CM

## 2024-03-14 DIAGNOSIS — R5383 Other fatigue: Secondary | ICD-10-CM

## 2024-03-14 NOTE — Progress Notes (Signed)
 Patient ID: Denise Jennings, female   DOB: April 02, 1986, 38 y.o.   MRN: [id number]  HPI: Denise Jennings is a 38 y.o. 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  Last visit 1 year ago.  Before our first visit in 04/2022: Patient described that she had 2 menstrual cycles after menarche, after which she developed secondary amenorrhea.  Further investigation revealed polycystic ovaries, and, reportedly, high testosterone  level.  She was diagnosed with PCOS and started on oral contraceptives.    In 2021, she started a Chinese herbal tea, which she took fairly consistently for a year.  She had menstrual cycles approximately every 2 months during this time.  She then stopped to see if her menstrual cycles resumed spontaneously, but they did not.  At that time, she presented to see OB/GYN and the prolactin level was high (I do not have these records).  She was referred to Dr. Kassie, whom she saw in 2022.  Pituitary MRI was checked and it showed a 4 mm microadenoma.  She was started on Cabergoline , which she continued for 26 weeks.  However, she was then found to be pregnant -[redacted] weeks along.  At that time she stopped Cabergoline .  She had a uneventful pregnancy and gave birth on 12/16/2021-healthy boy.  She was breast-feeding but she did not have a large supply (she felt that she only had approximately 50% of the history supply) so she supplemented with formula.    At our initial visit:  She was breast-feeding so we could not check prolactin and the rest of the pituitary tests.  I ordered these and advised her to come back after she finished breast-feeding her son.  At last visit:  She finished breast-feeding 03/2022.  She was feeling well, without vision problems (except dry eyes), but still had galactorrhea with stimulation only. She has occasional HAs. Some memory pbs, which she felt were due to fatigue. She was waking up at night with the baby (19 months old). Her menstrual cycles are  coming 1 mo apart, occasionally lighter, but much more regular than before pregnancy.  At today's visit: She is feeling well, without complaints today.  No galactorrhea. She has menses every month, but at different time intervals.  Reviewed previous investigation: Pituitary MRI (11/29/2020): 4 mm area of delayed enhancement along the far right side of the gland. In association with slight leftward deviation of the pituitary stalk, this is suggestive of a pituitary microadenoma.  Prolactin levels available for review was normal: Lab Results  Component Value Date   PROLACTIN 14.0 02/22/2023   PROLACTIN 11.9 09/15/2021   PROLACTIN 6.0 04/07/2021   Otherwise, pituitary hormone evaluation was normal: Component     Latest Ref Rng 09/15/2021  FSH     mIU/ML 0.1   LH     mIU/mL 0.67   TSH     0.35 - 5.50 uIU/mL 1.06    Component     Latest Ref Rng 02/22/2023  Prolactin     ng/mL 14.0   TSH     0.35 - 5.50 uIU/mL 0.75   FSH     mIU/ML 7.5   LH     mIU/mL 8.46   T4,Free(Direct)     0.60 - 1.60 ng/dL 9.10   Cortisol, Plasma     ug/dL 86.4   Triiodothyronine,Free,Serum     2.3 - 4.2 pg/mL 3.1   17-OH-Progesterone, LC/MS/MS      ng/dL 63   R793 ACTH   6 - 50 pg/mL 7   Growth Hormone     < OR = 7.1 ng/mL 4.4   IGF-I, LC/MS     53 - 331 ng/mL 199   Z-Score (Female)     -2.0 - 2.0 SD 0.7   hCG Quant     mIU/mL <1   DHEA-Sulfate, LCMS     ug/dL 43   Testosterone , Serum (Total)     ng/dL 23   % Free Testosterone      % 1.4   Free Testosterone , S     pg/mL 3.2   Sex Hormone Binding Globulin     nmol/L 46.5    06/04/2023: TSH 0.56  Fertility/Menstrual cycles: - she had 2 menses after menarche >> then breast-feeding >> now regular postpartum - + h/o ovarian cysts - U/S while a teenager - children: 1 - miscarriages: 0  Acne: - no  Hirsutism: - no  Weight gain: - no - no steroid use - no weight loss meds - Exercise:  none  Treatments tried: - did not try  Metformin - did not try Spironolactone - did not try Vaniqa - prev. OCPs - prev. chinese herbal tea  Reviewed HbA1c levels: 06/04/2023: HbA1c 5.5% Lab Results  Component Value Date   HGBA1C 5.7 02/22/2023   No chronic kidney disease: 06/04/2023: BUN/creatinine 13/0.58, GFR 118, glucose 84   Chemistry   No results found for: NA, K, CL, CO2, BUN, CREATININE, GLU No results found for: CALCIUM, ALKPHOS, AST, ALT, BILITOT   No hyperlipidemia: 06/04/2023: 171/55/71/89 No results found for: CHOL, HDL, LDLCALC, LDLDIRECT, TRIG, CHOLHDL  Paternal aunt has PCOS. She does not have family history of pituitary tumors,  infertility.  On vitamin D  2000 units, Fish oil, prenatal vitamin, sulphoraphane, Mg glycinate, Mg threonate, creatine, lutein, choline. She was on Inositol >> now off after her menses became irregular.   ROS: + see HPI  Past Medical History:  Diagnosis Date   Pituitary microadenoma (HCC)    No past surgical history on file. Social History   Socioeconomic History   Marital status: Married    Spouse name: Not on file   Number of children: Not on file   Years of education: Not on file   Highest education level: Not on file  Occupational History   Not on file  Tobacco Use   Smoking status: Never   Smokeless tobacco: Never  Vaping Use   Vaping status: Never Used  Substance and Sexual Activity   Alcohol use: Never   Drug use: Never   Sexual activity: Yes    Birth control/protection: Pill  Other Topics Concern   Not on file  Social History Narrative   Not on file   Social Drivers of Health   Financial Resource Strain: Not on file  Food Insecurity: Not on file  Transportation Needs: Not on file  Physical Activity: Not on file  Stress: Not on file  Social Connections: Not on file  Intimate Partner Violence: Not on file   No current outpatient medications on file prior to visit.   No current facility-administered  medications on file prior to visit.   Allergies  Allergen Reactions   Contrast Media [Iodinated Contrast Media] Other (See Comments)    Feels like needles pinching my body   Family History  Problem Relation Age of Onset   Other Neg Hx    PE: BP 100/80   Pulse 74   Ht 5' 3 (1.6 m)   Wt 130 lb (59  kg)   SpO2 96%   BMI 23.03 kg/m  Wt Readings from Last 3 Encounters:  03/14/24 130 lb (59 kg)  02/21/23 139 lb 12.8 oz (63.4 kg)  04/26/22 139 lb 3.2 oz (63.1 kg)   Constitutional: normal weight, in NAD, no full supraclavicular fat pads Eyes: no exophthalmos ENT: no thyromegaly, no cervical lymphadenopathy Cardiovascular: RRR, No MRG Respiratory: CTA B Musculoskeletal: no deformities Skin: no acne on face, no dark terminal hair on face, no vellum on sideburns, no skin tags, no acanthosis nigricans Neurological: no tremor with outstretched hands  ASSESSMENT: 1. PCOS  2.  Pituitary microadenoma  PLAN: 1. PCOS -Patient with a history of PCOS diagnosed when she was a teenager and had absent menstrual cycles after menarche, polycystic ovaries on ultrasound, and a high testosterone  level. -At our first visit, we discussed the etiology of PCOS, a dysfunction of the pituitary gonadal pulse generator in which FSH and LH hormones are secreted in pulses with abnormal frequency.  As a consequence, the patient may have excessive estrogen and testosterone  levels.  Discussed that patients with PCOS had a higher risk of weight gain, history Let's, and therefore a higher risk of diabetes later in life.  He also could have acne, he uses a, irregular menstrual cycles and decreased fertility.  There was no unifying treatment for PCOS, but oral contraceptives were first-line, metformin could also be used but a referral to fertility medicine may be necessary.  However, patient had a successful pregnancy after using the Chinese herbal tea (she was not sure about the ingredients) and she was able to  successfully breast-feed. -After her pregnancy, planning and check her pituitary labs and they were all normal.  Testosterone , DHEA-S, send progesterone and HbA1c were also all normal - At today's visit, patient is feeling well, without complaints, and her menses are regular.  She is not on an OCP. - Due to fatigue, she would be interested for me to check her vitamin D  level today along with the rest of the labs. Will also repeat an HbA1c level, along with a TSH and prolactin level -She inquires about other ways to check her insulin  resistance.  We discussed that we could do an OGTT.  For now, we will check an HbA1c only. -She has regular menstrual cycles after starting an inositol supplement.  She is now off this, but her menstrual cycles are coming at different times each month.  We discussed about possibly using OCPs, but not for now, since she is trying for pregnancy. We discussed that if she does not get pregnant within a year, she would probably need to see a fertility specialist.  In the meantime, since she is actively trying for pregnancy, I advised her to stop most of her supplements with the exception of the vitamin D  (2000 units), and the prenatal multivitamin. - Plan to see her back in a year  2.  Pituitary microadenoma - Observed on the MRI from: Measuring 4 mm -I do not have records of elevated prolactin, but this was mentioned in Dr. Laymond previous notes.  She was started on Cabergoline  0.25 mg twice a day, however, retrospectively, she was actually pregnant at the time of the high prolactin level so she was started on Cabergoline  during pregnancy.  She stopped this as soon as she found out that she was pregnant.  She remained off the medication afterwards.  Prolactin was normal in 08/2021, and again at last visit in 2024.  We did discuss that the  high prolactin appears to be related to pregnancy, rather than her pituitary tumor -We will repeat her prolactin level today -If the  prolactin level increases, we may use Cabergoline  again, however, she is trying for new pregnancy, so we would only start this after possible pregnancy and breast-feeding -No need to repeat her pituitary hormone investigation beyond a TSH and prolactin level today - I would suggest to repeat another MRI now to see if the pituitary tumor changed in size - she agrees with this  Orders Placed This Encounter  Procedures   MR Brain W Wo Contrast   TSH   Prolactin   Hemoglobin A1c   Vitamin D , 25-hydroxy   Lela Fendt, MD PhD [facility]

## 2024-03-14 NOTE — Patient Instructions (Addendum)
Please stop at the lab.  Please return in 1 year.  

## 2024-03-15 ENCOUNTER — Ambulatory Visit: Payer: Self-pay | Admitting: Internal Medicine

## 2024-03-15 LAB — VITAMIN D 25 HYDROXY (VIT D DEFICIENCY, FRACTURES): Vit D, 25-Hydroxy: 34 ng/mL (ref 30–100)

## 2024-03-15 LAB — HEMOGLOBIN A1C
Hgb A1c MFr Bld: 5.5 % (ref <5.7)
Mean Plasma Glucose: 111 mg/dL
eAG (mmol/L): 6.2 mmol/L

## 2024-03-15 LAB — PROLACTIN: Prolactin: 9.5 ng/mL

## 2024-03-15 LAB — TSH: TSH: 0.67 m[IU]/L

## 2024-03-28 ENCOUNTER — Encounter: Payer: Self-pay | Admitting: Internal Medicine

## 2024-04-05 DIAGNOSIS — H5213 Myopia, bilateral: Secondary | ICD-10-CM | POA: Diagnosis not present

## 2024-04-13 ENCOUNTER — Other Ambulatory Visit

## 2024-05-02 ENCOUNTER — Ambulatory Visit
Admission: RE | Admit: 2024-05-02 | Discharge: 2024-05-02 | Disposition: A | Discharge status: Home / Self Care | Source: Ambulatory Visit | Attending: Internal Medicine | Admitting: Internal Medicine

## 2024-05-02 DIAGNOSIS — D352 Benign neoplasm of pituitary gland: Secondary | ICD-10-CM

## 2024-05-02 DIAGNOSIS — C729 Malignant neoplasm of central nervous system, unspecified: Secondary | ICD-10-CM | POA: Diagnosis not present

## 2024-05-02 MED ORDER — GADOPICLENOL 0.5 MMOL/ML IV SOLN
6.0000 mL | Freq: Once | INTRAVENOUS | Status: AC | PRN
  Administered 2024-05-02: 6 mL via INTRAVENOUS

## 2025-03-14 ENCOUNTER — Ambulatory Visit: Admitting: Internal Medicine
